# Patient Record
Sex: Female | Born: 1980
Health system: Southern US, Community
[De-identification: ages and names within clinical notes are randomized; demographics above are authoritative.]

## PROBLEM LIST (undated history)

## (undated) DIAGNOSIS — Z9889 Other specified postprocedural states: Secondary | ICD-10-CM

## (undated) DIAGNOSIS — G8921 Chronic pain due to trauma: Secondary | ICD-10-CM

## (undated) DIAGNOSIS — Z974 Presence of external hearing-aid: Secondary | ICD-10-CM

## (undated) DIAGNOSIS — K219 Gastro-esophageal reflux disease without esophagitis: Secondary | ICD-10-CM

## (undated) DIAGNOSIS — F419 Anxiety disorder, unspecified: Secondary | ICD-10-CM

## (undated) DIAGNOSIS — R112 Nausea with vomiting, unspecified: Secondary | ICD-10-CM

## (undated) DIAGNOSIS — N809 Endometriosis, unspecified: Secondary | ICD-10-CM

## (undated) DIAGNOSIS — Z8489 Family history of other specified conditions: Secondary | ICD-10-CM

## (undated) HISTORY — DX: Chronic pain due to trauma: G89.21

## (undated) HISTORY — PX: CHOLECYSTECTOMY: SHX55

## (undated) HISTORY — DX: Endometriosis, unspecified: N80.9

## (undated) HISTORY — DX: Anxiety disorder, unspecified: F41.9

## (undated) HISTORY — DX: Gastro-esophageal reflux disease without esophagitis: K21.9

## (undated) HISTORY — PX: OTHER SURGICAL HISTORY: SHX169

---

## 2005-05-14 ENCOUNTER — Ambulatory Visit: Payer: Self-pay | Admitting: General Surgery

## 2005-12-16 HISTORY — PX: DILATION AND CURETTAGE OF UTERUS: SHX78

## 2006-02-19 ENCOUNTER — Ambulatory Visit: Payer: Self-pay | Admitting: General Practice

## 2006-03-17 ENCOUNTER — Emergency Department: Payer: Self-pay | Admitting: Emergency Medicine

## 2006-03-25 ENCOUNTER — Ambulatory Visit: Payer: Self-pay | Admitting: Gastroenterology

## 2006-05-13 ENCOUNTER — Ambulatory Visit: Payer: Self-pay | Admitting: Unknown Physician Specialty

## 2006-12-16 HISTORY — PX: TONSILLECTOMY: SUR1361

## 2007-02-19 ENCOUNTER — Emergency Department: Payer: Self-pay | Admitting: General Practice

## 2007-02-24 ENCOUNTER — Ambulatory Visit: Payer: Self-pay | Admitting: General Practice

## 2007-11-27 ENCOUNTER — Ambulatory Visit: Payer: Self-pay | Admitting: General Practice

## 2007-12-22 ENCOUNTER — Ambulatory Visit: Payer: Self-pay | Admitting: General Practice

## 2008-03-05 ENCOUNTER — Emergency Department: Payer: Self-pay | Admitting: Emergency Medicine

## 2008-03-24 ENCOUNTER — Ambulatory Visit: Payer: Self-pay | Admitting: Obstetrics and Gynecology

## 2008-04-01 ENCOUNTER — Ambulatory Visit: Payer: Self-pay | Admitting: Obstetrics and Gynecology

## 2009-07-30 ENCOUNTER — Emergency Department: Payer: Self-pay | Admitting: Emergency Medicine

## 2009-11-29 ENCOUNTER — Ambulatory Visit: Payer: Self-pay | Admitting: General Practice

## 2009-12-16 DIAGNOSIS — D332 Benign neoplasm of brain, unspecified: Secondary | ICD-10-CM

## 2009-12-16 HISTORY — DX: Benign neoplasm of brain, unspecified: D33.2

## 2009-12-16 HISTORY — PX: SHOULDER SURGERY: SHX246

## 2009-12-16 HISTORY — PX: BRAIN SURGERY: SHX531

## 2010-01-27 ENCOUNTER — Ambulatory Visit: Payer: Self-pay | Admitting: General Practice

## 2010-01-31 ENCOUNTER — Ambulatory Visit: Payer: Self-pay | Admitting: General Practice

## 2010-02-13 ENCOUNTER — Inpatient Hospital Stay (HOSPITAL_COMMUNITY): Admission: RE | Admit: 2010-02-13 | Discharge: 2010-02-14 | Payer: Self-pay | Admitting: Neurosurgery

## 2010-02-13 ENCOUNTER — Encounter (INDEPENDENT_AMBULATORY_CARE_PROVIDER_SITE_OTHER): Payer: Self-pay | Admitting: Neurosurgery

## 2010-03-27 ENCOUNTER — Emergency Department: Payer: Self-pay | Admitting: Emergency Medicine

## 2010-04-07 ENCOUNTER — Ambulatory Visit: Payer: Self-pay | Admitting: Neurosurgery

## 2010-06-29 ENCOUNTER — Ambulatory Visit: Payer: Self-pay | Admitting: General Practice

## 2010-10-06 ENCOUNTER — Emergency Department: Payer: Self-pay | Admitting: Emergency Medicine

## 2011-01-10 ENCOUNTER — Other Ambulatory Visit: Payer: Self-pay | Admitting: Obstetrics and Gynecology

## 2011-03-08 LAB — DIFFERENTIAL
Basophils Absolute: 0 10*3/uL (ref 0.0–0.1)
Lymphocytes Relative: 34 % (ref 12–46)
Lymphs Abs: 2.2 10*3/uL (ref 0.7–4.0)
Monocytes Absolute: 0.4 10*3/uL (ref 0.1–1.0)
Monocytes Relative: 6 % (ref 3–12)
Neutro Abs: 3.7 10*3/uL (ref 1.7–7.7)

## 2011-03-08 LAB — CBC
HCT: 36.8 % (ref 36.0–46.0)
Hemoglobin: 12.8 g/dL (ref 12.0–15.0)
RBC: 4.16 MIL/uL (ref 3.87–5.11)
WBC: 6.4 10*3/uL (ref 4.0–10.5)

## 2011-03-08 LAB — TYPE AND SCREEN

## 2011-03-08 LAB — SURGICAL PCR SCREEN
MRSA, PCR: NEGATIVE
Staphylococcus aureus: POSITIVE — AB

## 2011-03-08 LAB — ABO/RH: ABO/RH(D): O POS

## 2011-03-23 ENCOUNTER — Emergency Department: Payer: Self-pay | Admitting: Emergency Medicine

## 2011-04-16 ENCOUNTER — Emergency Department: Payer: Self-pay | Admitting: Emergency Medicine

## 2011-06-18 ENCOUNTER — Ambulatory Visit: Payer: Self-pay | Admitting: General Practice

## 2011-07-13 IMAGING — CR DG CHEST 2V
1 series · 2 of 2 positions shown · non-contrast
Comparison: none

REASON FOR EXAM: pain after injury  -  ed waiting room
COMMENTS:   May transport without cardiac monitor

PROCEDURE:     DXR - DXR CHEST PA (OR AP) AND LATERAL  - April 16, 2011  [DATE]
RESULT:     The lung fields are clear. No pneumonia, pneumothorax or pleural
effusion is seen. The heart size is normal. The mediastinal and osseous
structures reveal no significant abnormalities.

[Series 1: view not recorded · 0.17mm/px · 2 of 2 slices shown]
[im 1/2]
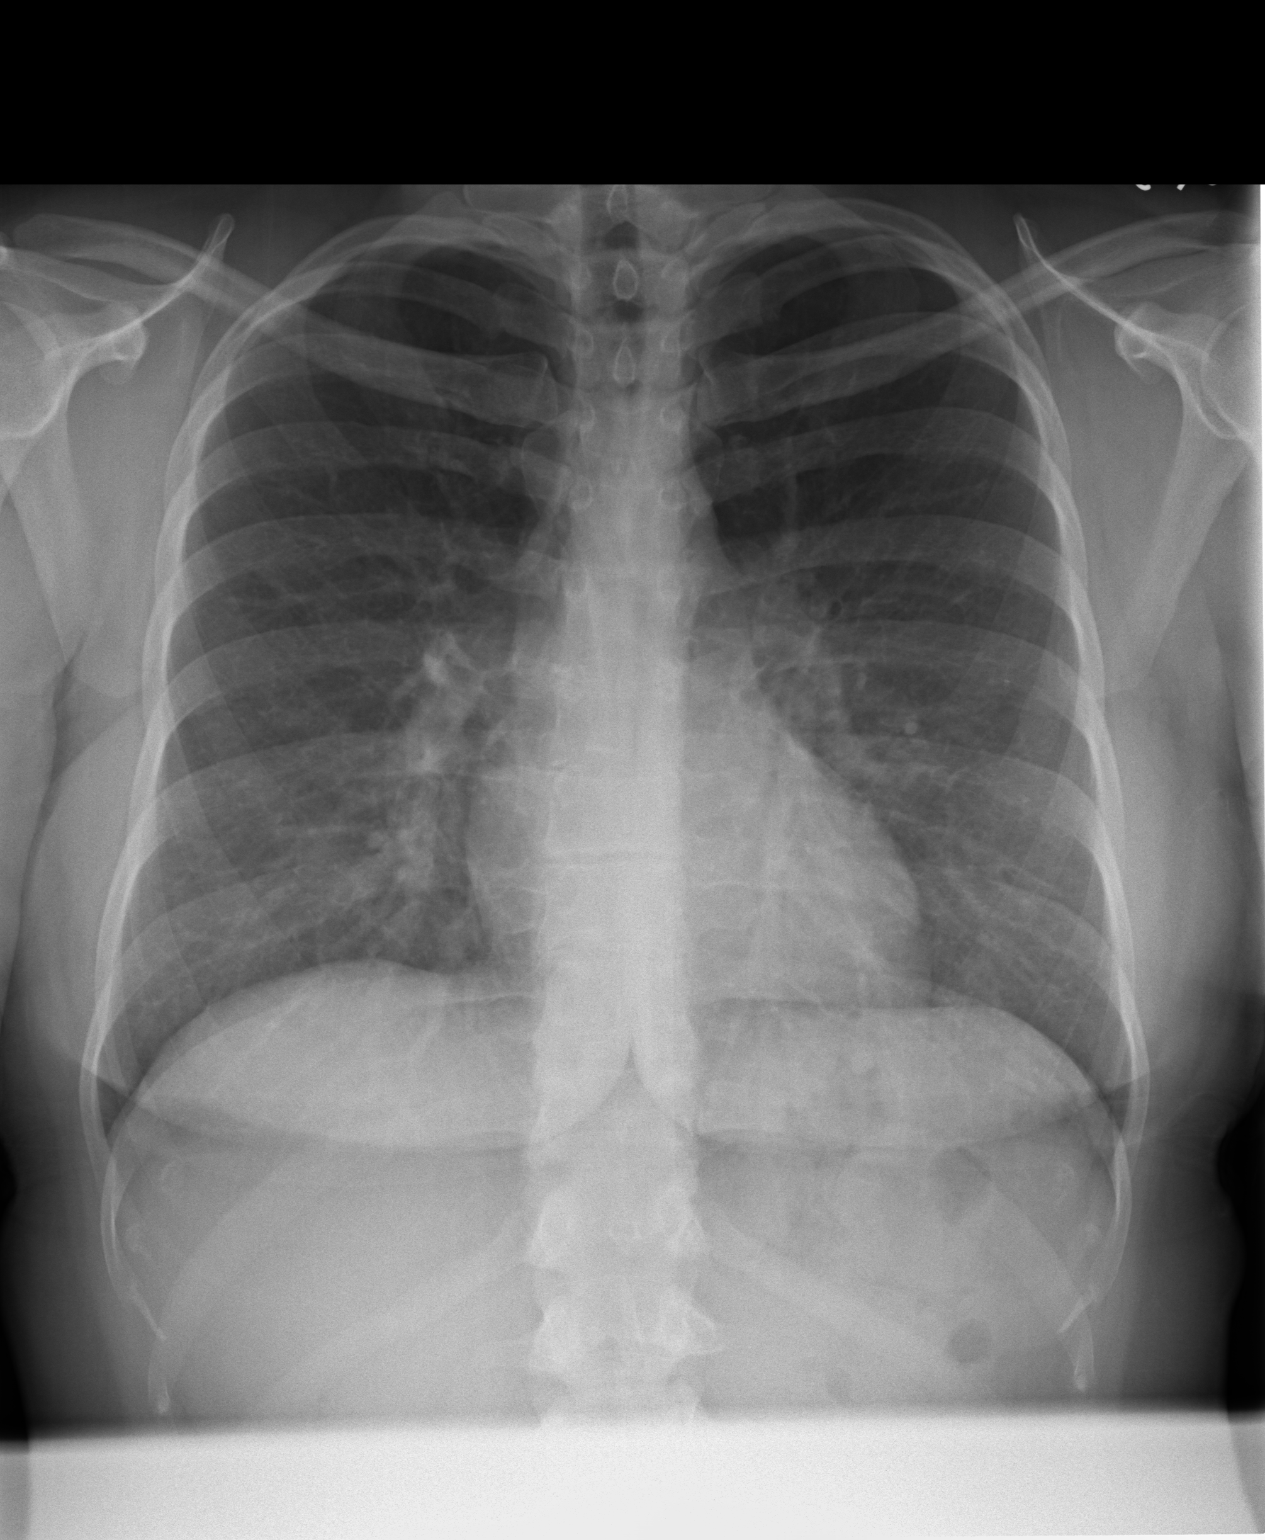
[im 2/2]
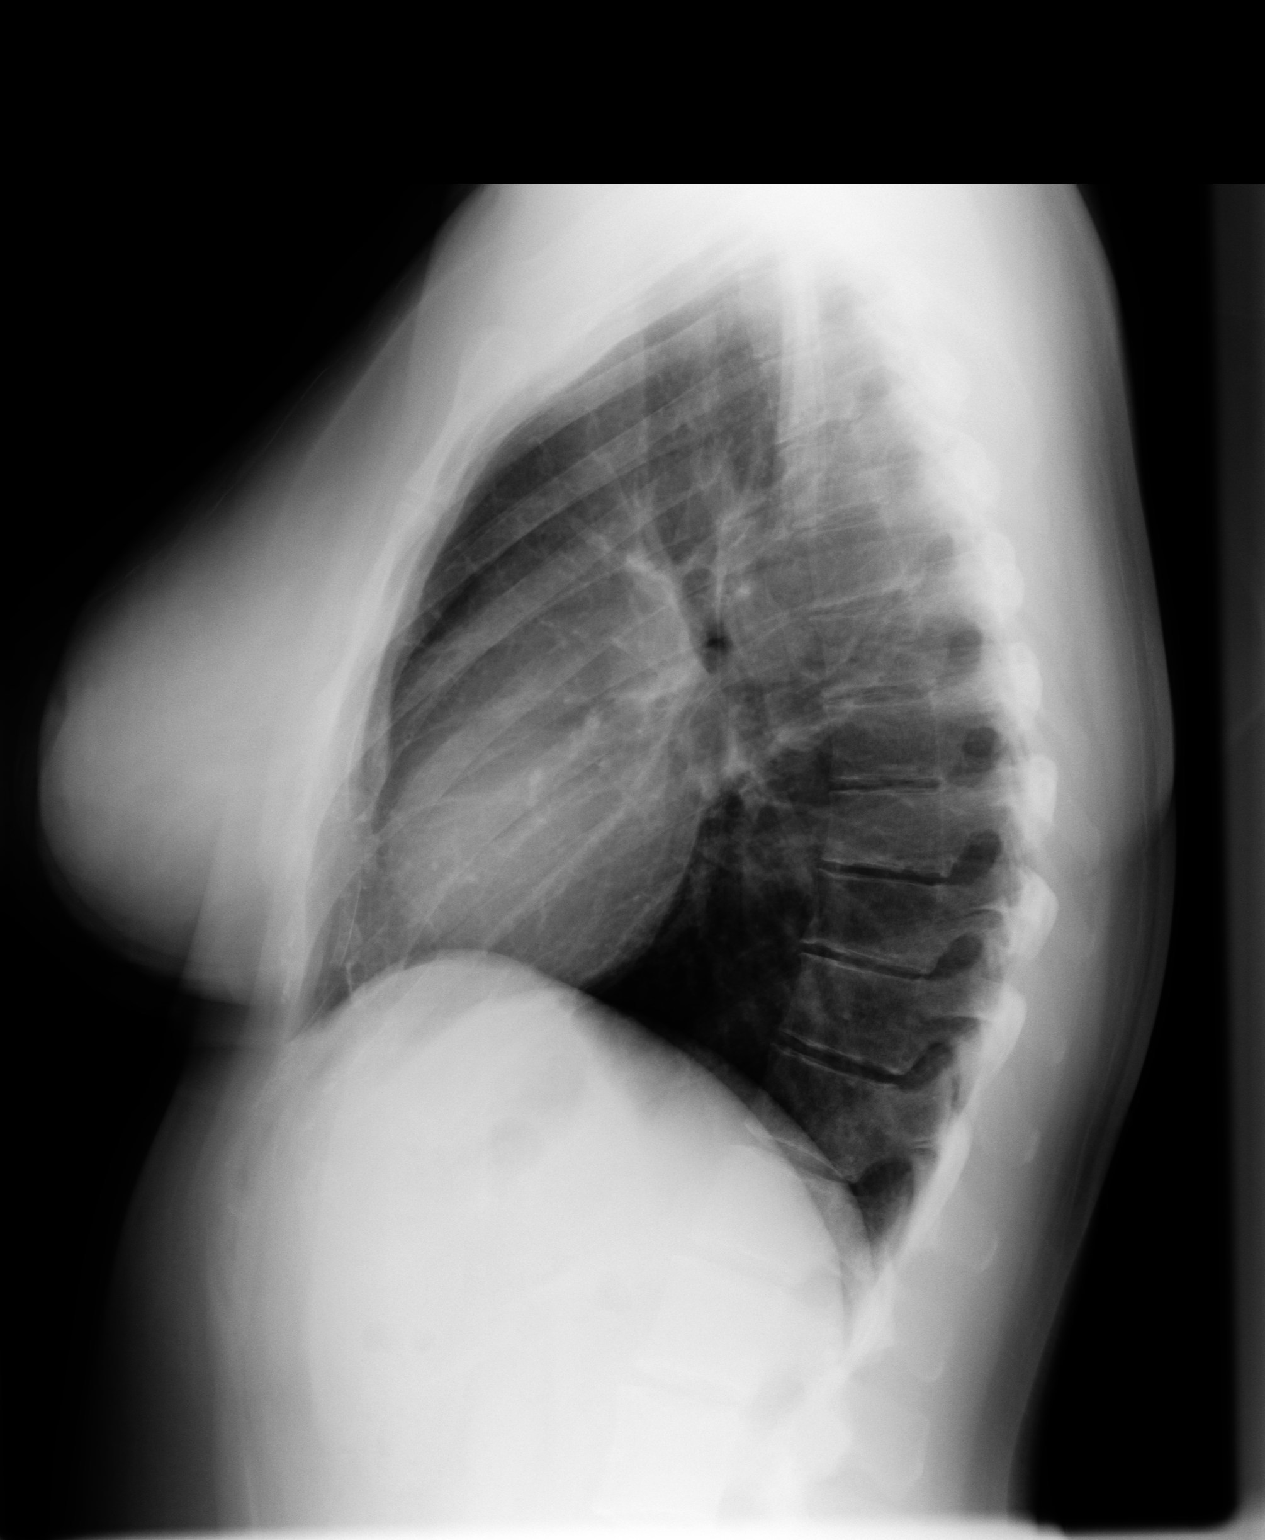

[2 of 2 positions shown; findings below may reference images not displayed]

IMPRESSION: No acute changes are identified.

## 2011-12-17 HISTORY — PX: SPINE SURGERY: SHX786

## 2012-01-10 ENCOUNTER — Encounter: Payer: Self-pay | Admitting: General Practice

## 2012-01-17 ENCOUNTER — Encounter: Payer: Self-pay | Admitting: General Practice

## 2012-02-14 ENCOUNTER — Encounter: Payer: Self-pay | Admitting: General Practice

## 2012-08-31 ENCOUNTER — Ambulatory Visit: Payer: Self-pay | Admitting: Unknown Physician Specialty

## 2013-02-05 ENCOUNTER — Emergency Department: Payer: Self-pay | Admitting: Unknown Physician Specialty

## 2013-02-05 LAB — CBC
MCHC: 33.1 g/dL (ref 32.0–36.0)
RDW: 13 % (ref 11.5–14.5)
WBC: 8.9 10*3/uL (ref 3.6–11.0)

## 2013-02-05 LAB — URINALYSIS, COMPLETE
Blood: NEGATIVE
Glucose,UR: NEGATIVE mg/dL (ref 0–75)
Hyaline Cast: 3
Specific Gravity: 1.008 (ref 1.003–1.030)
Squamous Epithelial: 6
WBC UR: 3 /HPF (ref 0–5)

## 2013-02-05 LAB — PREGNANCY, URINE: Pregnancy Test, Urine: NEGATIVE m[IU]/mL

## 2013-02-05 LAB — COMPREHENSIVE METABOLIC PANEL
Anion Gap: 4 — ABNORMAL LOW (ref 7–16)
Co2: 30 mmol/L (ref 21–32)
EGFR (African American): >60
EGFR (Non-African Amer.): >60
Potassium: 3.5 mmol/L (ref 3.5–5.1)
SGOT(AST): 29 U/L (ref 15–37)
Sodium: 140 mmol/L (ref 136–145)
Total Protein: 7.8 g/dL (ref 6.4–8.2)

## 2013-06-24 ENCOUNTER — Ambulatory Visit: Payer: Self-pay | Admitting: General Surgery

## 2013-07-29 ENCOUNTER — Encounter: Payer: Self-pay | Admitting: *Deleted

## 2015-05-29 ENCOUNTER — Encounter: Payer: Self-pay | Admitting: General Surgery

## 2015-05-29 ENCOUNTER — Other Ambulatory Visit: Payer: Self-pay | Admitting: Physician Assistant

## 2015-05-29 DIAGNOSIS — R928 Other abnormal and inconclusive findings on diagnostic imaging of breast: Secondary | ICD-10-CM

## 2015-05-29 DIAGNOSIS — R920 Mammographic microcalcification found on diagnostic imaging of breast: Secondary | ICD-10-CM

## 2015-06-02 ENCOUNTER — Ambulatory Visit
Admission: RE | Admit: 2015-06-02 | Discharge: 2015-06-02 | Disposition: A | Discharge status: Home / Self Care | Payer: BLUE CROSS/BLUE SHIELD | Source: Ambulatory Visit | Attending: Physician Assistant | Admitting: Physician Assistant

## 2015-06-02 DIAGNOSIS — R920 Mammographic microcalcification found on diagnostic imaging of breast: Secondary | ICD-10-CM

## 2015-06-02 DIAGNOSIS — R928 Other abnormal and inconclusive findings on diagnostic imaging of breast: Secondary | ICD-10-CM | POA: Insufficient documentation

## 2015-06-06 ENCOUNTER — Encounter: Payer: Self-pay | Admitting: General Surgery

## 2015-06-07 ENCOUNTER — Other Ambulatory Visit: Payer: BLUE CROSS/BLUE SHIELD

## 2015-06-07 ENCOUNTER — Encounter: Payer: Self-pay | Admitting: General Surgery

## 2015-06-07 ENCOUNTER — Ambulatory Visit (INDEPENDENT_AMBULATORY_CARE_PROVIDER_SITE_OTHER): Payer: BLUE CROSS/BLUE SHIELD | Admitting: General Surgery

## 2015-06-07 VITALS — BP 110/68 | HR 71 | Resp 13 | Ht 63.0 in | Wt 157.0 lb

## 2015-06-07 DIAGNOSIS — N644 Mastodynia: Secondary | ICD-10-CM | POA: Diagnosis not present

## 2015-06-07 DIAGNOSIS — M79622 Pain in left upper arm: Secondary | ICD-10-CM

## 2015-06-07 DIAGNOSIS — N6452 Nipple discharge: Secondary | ICD-10-CM

## 2015-06-07 DIAGNOSIS — M25512 Pain in left shoulder: Secondary | ICD-10-CM | POA: Diagnosis not present

## 2015-06-07 NOTE — Progress Notes (Signed)
Patient ID: Elizabeth Peters, female   DOB: 27-Nov-1981, 34 y.o.   MRN: [id number]  Chief Complaint  Patient presents with  . Other    right breast cyst, left axillary nodule    HPI Elizabeth Peters is a 34 y.o. female here for assessment of a right breast cyst and left axillary nodule and pain. She reports this started with sharp pains in left nipple and pain under arm about 3 weeks ago. She reports the pain as constant. She also reports nipple discharge that is clear and spontaneously occurs for about a week at a time, once a month. This discharge is nonbloody. This has been occuring for about a year but is not related to her cycle.  She has a right breast mass that was found on her exam. She reports that area as tender when palpated. She reports that she no longer has sensation in her left nipple since the onset of the left lateral breast pain 3 weeks ago . Her last mammogram and ultrasound was on 06/02/15 [facility]. She does not do self breast exams.  HPI  Past Medical History  Diagnosis Date  . Chronic pain due to injury   . Endometriosis     Past Surgical History  Procedure Laterality Date  . Dilation and curettage of uterus  2007  . Tonsillectomy  2008  . Brain surgery  2011    tumor  . Shoulder surgery Left 2011  . Spine surgery  2013    neck fulsion    Family History  Problem Relation Age of Onset  . Breast cancer Mother 66  . Skin cancer Father   . Heart attack Father     Social History History  Substance Use Topics  . Smoking status: Former Smoker -- 4 years    Quit date: 12/16/2012  . Smokeless tobacco: Never Used  . Alcohol Use: No    Allergies  Allergen Reactions  . Aspirin Anaphylaxis  . Penicillins Anaphylaxis    Current Outpatient Prescriptions  Medication Sig Dispense Refill  . gabapentin (NEURONTIN) 300 MG capsule Take 300 mg by mouth 2 (two) times daily.  2  . NUCYNTA ER 50 MG TB12 Take 1 tablet by mouth 2 (two) times daily.  0  . oxymorphone (OPANA)  10 MG tablet Take 10 mg by mouth 2 (two) times daily.  0   No current facility-administered medications for this visit.    Review of Systems Review of Systems  Constitutional: Negative.   Respiratory: Negative.   Cardiovascular: Negative.     Blood pressure 110/68, pulse 71, resp. rate 13, height 5\' 3"  (1.6 m), weight 157 lb (71.215 kg), last menstrual period 06/01/2015.  Physical Exam Physical Exam  Constitutional: She is oriented to person, place, and time. She appears well-developed and well-nourished.  Eyes: Conjunctivae are normal. No scleral icterus.  Neck: Neck supple.  Cardiovascular: Normal rate, regular rhythm and normal heart sounds.   Pulmonary/Chest: Effort normal and breath sounds normal. Right breast exhibits no inverted nipple, no mass, no nipple discharge, no skin change and no tenderness. Left breast exhibits nipple discharge and tenderness. Left breast exhibits no inverted nipple, no mass and no skin change.  Lymphadenopathy:    She has no cervical adenopathy.    She has no axillary adenopathy.  Neurological: She is alert and oriented to person, place, and time.  Skin: Skin is warm and dry.    Data Reviewed 05/29/2015 city of [place] notes reviewed.  06/02/2015 bilateral mammograms  were reviewed and are unremarkable.  Ultrasound examination was completed of both the left axilla and the right breast in the area of questionable palpable abnormality without any radiologic abnormality noted.  Focused ultrasound examination of the left retroareolar area was completed due to the report of nipple drainage for the past year. Scanning throughout the area showed no discernible ductal dilatation, cystic or solid lesions or architectural distortion. BI-RADS-1. (No images).  Assessment    Benign breast exam. Left breast nipple drainage.    Plan    Nipple cytology was completed due to the long history reported by the patient and her family history of breast cancer  in her mother at age 35. This is likely to be normal.  There is no significant change in her diet, caffeine consumption or medications to account for the nipple drainage or breast tenderness.  Symptomatic measures for relief of her breast discomfort: Proper fitting underwear, local heat/ice and anti-inflammatory agents were recommended. Aleve, 2 tablets twice a day for a ten-day course has been encouraged, assuming no GI side effects, with a phone report in 2 weeks of her progress.    PCP: Kandace Parkins 06/08/2015, 6:27 PM

## 2015-06-07 NOTE — Patient Instructions (Signed)
May use aleve for pain twice daily for 10 days. Wear well fitting bra. May use heat or ice for comfort. Call with progress in 2 weeks.

## 2015-06-08 DIAGNOSIS — N644 Mastodynia: Secondary | ICD-10-CM | POA: Insufficient documentation

## 2015-06-08 DIAGNOSIS — M79629 Pain in unspecified upper arm: Secondary | ICD-10-CM | POA: Insufficient documentation

## 2015-06-08 DIAGNOSIS — N6452 Nipple discharge: Secondary | ICD-10-CM | POA: Insufficient documentation

## 2015-06-09 LAB — BREAST DISCHARGE CYTOLOGY

## 2015-06-15 ENCOUNTER — Telehealth: Payer: Self-pay | Admitting: *Deleted

## 2015-06-15 NOTE — Telephone Encounter (Signed)
Notified patient as instructed, patient pleased. Discussed follow-up appointments, patient agrees  

## 2015-06-15 NOTE — Telephone Encounter (Signed)
-----   Message from Robert Bellow, MD sent at 06/15/2015  8:08 AM EDT ----- Please notify the patient the cytology exam was normal. Follow-up by phone with progress regarding her discomfort as previously discussed. ----- Message -----    From: [facility] Lab Results In Interface    Sent: 06/09/2015   4:38 PM      To: Robert Bellow, MD

## 2016-07-09 ENCOUNTER — Encounter: Payer: Self-pay | Admitting: Obstetrics and Gynecology

## 2016-08-14 ENCOUNTER — Encounter: Payer: Self-pay | Admitting: Obstetrics and Gynecology

## 2016-09-30 ENCOUNTER — Ambulatory Visit
Admission: RE | Admit: 2016-09-30 | Discharge: 2016-09-30 | Disposition: A | Discharge status: Home / Self Care | Payer: BLUE CROSS/BLUE SHIELD | Source: Ambulatory Visit | Attending: Family Medicine | Admitting: Family Medicine

## 2016-09-30 ENCOUNTER — Other Ambulatory Visit: Payer: Self-pay | Admitting: Family Medicine

## 2016-09-30 DIAGNOSIS — N839 Noninflammatory disorder of ovary, fallopian tube and broad ligament, unspecified: Secondary | ICD-10-CM | POA: Diagnosis present

## 2016-09-30 DIAGNOSIS — N838 Other noninflammatory disorders of ovary, fallopian tube and broad ligament: Secondary | ICD-10-CM

## 2017-02-10 ENCOUNTER — Encounter: Payer: Self-pay | Admitting: *Deleted

## 2017-02-10 ENCOUNTER — Ambulatory Visit
Admission: RE | Admit: 2017-02-10 | Discharge: 2017-02-10 | Disposition: A | Discharge status: Home / Self Care | Payer: Self-pay | Source: Ambulatory Visit | Attending: Oncology | Admitting: Oncology

## 2017-02-10 ENCOUNTER — Ambulatory Visit: Payer: Self-pay | Attending: Oncology | Admitting: *Deleted

## 2017-02-10 ENCOUNTER — Encounter (INDEPENDENT_AMBULATORY_CARE_PROVIDER_SITE_OTHER): Payer: Self-pay

## 2017-02-10 VITALS — BP 119/79 | HR 65 | Temp 98.6°F | Ht 64.96 in | Wt 173.3 lb

## 2017-02-10 DIAGNOSIS — N63 Unspecified lump in unspecified breast: Secondary | ICD-10-CM

## 2017-02-10 NOTE — Progress Notes (Signed)
Subjective:     Patient ID: Elizabeth Peters, female   DOB: 1981/10/25, 36 y.o.   MRN: [id number]  HPI   Review of Systems     Objective:   Physical Exam  Pulmonary/Chest: Right breast exhibits tenderness. Right breast exhibits no inverted nipple, no mass, no nipple discharge and no skin change. Left breast exhibits mass, nipple discharge and tenderness. Left breast exhibits no inverted nipple and no skin change. Breasts are symmetrical.         Assessment:     36 year old White female referred to [place] by Dr. Corinda Gubler at the [facility] for bilateral breast masses and left bloody nipple discharge.  Patient states her nipple turned "purple" about 3 weeks ago and she started having bloody nipple discharge from the left breast.  States she is having pain at the 6:00 area radiating to the sternum.  States "I almost thought I was having a heart attack"  The pain does not correlate with her menstrual cycle since she is on her cycle today.  States the bloody nipple discharge is spontaneous, and just appears as a bright red blood in her bra.  On clinical breast exam I can palpate tender fibroglandular like nodular soft tissue at 6:00 right breast, and a tender approximate 4 X1 cm thickening at 6:00 left breast.  There is no nipple discharge on exam today.  Patient was seen in June 2016 for left breast nipple discharge and mass with benign findings.  Patient has been screened for eligibility.  She does not have any insurance, Medicare or Medicaid.  She also meets financial eligibility.  Hand-out given on the Affordable Care Act.    Plan:     Bilateral diagnostic mammogram and ultrasound ordered.  The patient has also been scheduled to see Dr. Bary Castilla on 02/20/17 @ 4:00 for further evaluation of the masses and bloody nipple discharge.  Will Follow - up per [facility] protocol.

## 2017-02-10 NOTE — Patient Instructions (Signed)
Gave patient hand-out, Women Staying Healthy, Active and Well from [facility], with education on breast health, pap smears, heart and colon health. 

## 2017-02-17 ENCOUNTER — Encounter: Payer: Self-pay | Admitting: *Deleted

## 2017-02-20 ENCOUNTER — Ambulatory Visit: Payer: BLUE CROSS/BLUE SHIELD | Admitting: General Surgery

## 2017-03-05 ENCOUNTER — Telehealth: Payer: Self-pay | Admitting: *Deleted

## 2017-03-05 NOTE — Telephone Encounter (Signed)
Called patient today to discuss follow up with a surgeon.  States she cancelled her appointment after the radiologist told her everything was fine.  I did discuss the need for a second opinion with a surgeon since she did have bloody nipple discharge and a thickening noted at 6:00 in the same breast.  States she will reschedule her appointment.

## 2017-03-24 ENCOUNTER — Encounter: Payer: Self-pay | Admitting: General Surgery

## 2017-03-24 ENCOUNTER — Ambulatory Visit (INDEPENDENT_AMBULATORY_CARE_PROVIDER_SITE_OTHER): Payer: PRIVATE HEALTH INSURANCE | Admitting: General Surgery

## 2017-03-24 ENCOUNTER — Inpatient Hospital Stay: Payer: Self-pay

## 2017-03-24 VITALS — BP 118/74 | HR 80 | Resp 12 | Ht 65.0 in | Wt 174.0 lb

## 2017-03-24 DIAGNOSIS — N644 Mastodynia: Secondary | ICD-10-CM

## 2017-03-24 DIAGNOSIS — N6452 Nipple discharge: Secondary | ICD-10-CM

## 2017-03-24 NOTE — Progress Notes (Addendum)
Patient ID: Elizabeth Peters, female   DOB: 1981-04-22, 36 y.o.   MRN: [id number]  Chief Complaint  Patient presents with  . Other    HPI Elizabeth Peters is a 36 y.o. female who presents for a breast evaluation. The most recent mammogram and ultrasounds was done on 02/10/2017 .  Patient does not perform regular self breast checks and gets regular mammograms done.  Patient states she has been having left nipple discharge and this has been going on since January 2018.She states the discharge is clean with a tint of blood. She states the left nipple is very ichthy and painful to touch.The chest wall is sore also.  Family history of breast cancer.  HPI  Past Medical History:  Diagnosis Date  . Chronic pain due to injury   . Endometriosis     Past Surgical History:  Procedure Laterality Date  . BRAIN SURGERY  2011   tumor  . DILATION AND CURETTAGE OF UTERUS  2007  . SHOULDER SURGERY Left 2011  . SPINE SURGERY  2013   neck fulsion  . TONSILLECTOMY  2008    Family History  Problem Relation Age of Onset  . Breast cancer Mother 62  . Skin cancer Father   . Heart attack Father   . Colon cancer Paternal Grandmother   . Lung cancer Paternal Grandfather   . Breast cancer Cousin     maternal side early 32's    Social History Social History  Substance Use Topics  . Smoking status: Former Smoker    Years: 4.00    Quit date: 12/16/2012  . Smokeless tobacco: Never Used  . Alcohol use No    Allergies  Allergen Reactions  . Aspirin Anaphylaxis  . Penicillins Anaphylaxis    Current Outpatient Prescriptions  Medication Sig Dispense Refill  . cloNIDine (CATAPRES) 0.1 MG tablet Take 0.1 mg by mouth 2 (two) times daily.    . cyclobenzaprine (FLEXERIL) 10 MG tablet Take 10 mg by mouth 3 (three) times daily as needed for muscle spasms.    Gean Birchwood ER 50 MG TB12 Take 1 tablet by mouth 2 (two) times daily.  0   No current facility-administered medications for this visit.     Review of  Systems Review of Systems  Constitutional: Negative.   Respiratory: Negative.   Cardiovascular: Negative.     Blood pressure 118/74, pulse 80, resp. rate 12, height 5\' 5"  (1.651 m), weight 174 lb (78.9 kg), last menstrual period 03/12/2017.  Physical Exam Physical Exam  Constitutional: She is oriented to person, place, and time. She appears well-developed and well-nourished.  Eyes: Conjunctivae are normal. No scleral icterus.  Neck: Neck supple.  Cardiovascular: Normal rate, regular rhythm and normal heart sounds.   Pulmonary/Chest: Effort normal and breath sounds normal. Right breast exhibits no inverted nipple, no mass, no nipple discharge, no skin change and no tenderness. Left breast exhibits tenderness ( left breast tenderness at 6o;clock and laterally.  ). Left breast exhibits no inverted nipple, no mass and no skin change.    Lymphadenopathy:    She has no cervical adenopathy.    She has no axillary adenopathy.  Neurological: She is alert and oriented to person, place, and time.  Skin: Skin is warm and dry.  Vitals reviewed.   Data: February 10, 2017 bilateral diagnostic mammogram and left breast ultrasound reviewed. Mammogram portion benign. Radiologist recommendation regarding future mammograms and potential for MRI exam were reviewed and discussed with the patient. BI-RADS-2. Physician  describes clear nipple drainage from 2 ducts.  Ultrasound of the retroareolar area was negative.  Ultrasound examination of the upper-outer quadrant of the left breast was completed due to a focal thickening noted on clinical exam. In the axillary tail there is a area encompassing what appeared be multiple small cyst 1.28 x 1.54 x 2.24 cm in diameter. This area approaches the overlying dermis to within 0.8 cm. Sick ounce remote from the prominence.  An area of soft fullness at the 2:00 position, 5 cm from the nipple is isodense with the adjacent subcutaneous fat and is suggested be of  adipose deposit measuring 0.9 x 1.3 x 1.8 cm. Focal acoustic enhancement is noted throughout the area and no vascular flow.  There is no evidence of ductal dilatation in the retroareolar area and it was not possible to reproduce any nipple drainage today. BI-RADS-2.  Assessment    Diffuse left breast tenderness, focal thickening related to microcyst.    Plan    Trial of anti-inflammatories is recommended.   Take two Advil  two times daily for two weeks. Use ocuvite, Call in two weeks to report.    HPI, Physical Exam, Assessment and Plan have been scribed under the direction and in the presence of Hervey Ard, MD.  Gaspar Cola, CMA  I have completed the exam and reviewed the above documentation for accuracy and completeness.  I agree with the above.  Haematologist has been used and any errors in dictation or transcription are unintentional.  Hervey Ard, M.D., F.A.C.S.  Robert Bellow 03/25/2017, 8:18 PM

## 2017-03-24 NOTE — Patient Instructions (Addendum)
Take two Advil  two times daily for two weeks. Use ocuvite, Call in two weeks to report.

## 2017-03-27 ENCOUNTER — Encounter: Payer: Self-pay | Admitting: *Deleted

## 2017-03-27 NOTE — Progress Notes (Signed)
Patient saw Dr. Bary Castilla for surgical consult.  She is to follow-up with him for symptom management.  Screening mammogram at age 36.  HSIS to [place].

## 2017-04-09 ENCOUNTER — Encounter: Payer: Self-pay | Admitting: *Deleted

## 2017-04-09 ENCOUNTER — Ambulatory Visit
Admission: EM | Admit: 2017-04-09 | Discharge: 2017-04-09 | Disposition: A | Discharge status: Home / Self Care | Payer: Self-pay | Attending: Family Medicine | Admitting: Family Medicine

## 2017-04-09 DIAGNOSIS — G43001 Migraine without aura, not intractable, with status migrainosus: Secondary | ICD-10-CM

## 2017-04-09 DIAGNOSIS — R110 Nausea: Secondary | ICD-10-CM

## 2017-04-09 MED ORDER — ONDANSETRON 8 MG PO TBDP
8.0000 mg | ORAL_TABLET | Freq: Once | ORAL | Status: AC
  Administered 2017-04-09: 8 mg via ORAL

## 2017-04-09 MED ORDER — KETOROLAC TROMETHAMINE 60 MG/2ML IM SOLN
60.0000 mg | Freq: Once | INTRAMUSCULAR | Status: AC
  Administered 2017-04-09: 60 mg via INTRAMUSCULAR

## 2017-04-09 NOTE — ED Provider Notes (Signed)
[facility]    CSN: [id number] Arrival date & time: 04/09/17  1511     History   Chief Complaint Chief Complaint  Patient presents with  . Migraine    HPI Elizabeth Peters is a 36 y.o. female.   Patient is a 36 year old white female whose on disability for the [employer] was here because of migraine headache. She reports having a migraine for about 3 days. This is a more extensive migraine that she normally had. Affect is probably the worst migraine that she's had however patient has had headaches before and has had headaches worsen this because she has had a brain tumor and attempted craniotomy to have a brain tumor removed. She states that was benign tumor she just had a CT scan a few months ago and everything was fine. She states that she is on disability now because after having the craniotomy for the brain tumor she went back to work and got flipped by her dog landing on her head and neck resulting in a fusion of her neck and injury to her left shoulder. She's doesn't have any drug allergies. She does state that Imitrex and Topamax did not work for her. She has some nausea now and has she puts it should like to get a shot in her buttocks to stop pain. She is willing to take some Zofran as well. She does have an allergy to aspirin but she can take ibuprofen so assuming that she could take Toradol as well. She does not smoke. No chronic medical problems otherwise. She's had a D&C for endometriosis, shoulder surgery craniotomy and neck fusion and a tonsillectomy. There is a history of migraines in the family but otherwise no pertinent family medical history relevant for today's visit. Should be noted that this headache has gone on for at least 3 days now and is not responding to usual home remedies.   The history is provided by the patient. No language interpreter was used.  Migraine  This is a new problem. The current episode started 2 days ago. Associated symptoms include  headaches. Nothing aggravates the symptoms. Nothing relieves the symptoms. She has tried acetaminophen, a cold compress, rest, food, water and a warm compress (Massage and trigger point treatments) for the symptoms.    Past Medical History:  Diagnosis Date  . Chronic pain due to injury   . Endometriosis     Patient Active Problem List   Diagnosis Date Noted  . Axillary pain 06/08/2015  . Discharge from left nipple 06/08/2015  . Mastalgia 06/08/2015    Past Surgical History:  Procedure Laterality Date  . BRAIN SURGERY  2011   tumor  . DILATION AND CURETTAGE OF UTERUS  2007  . SHOULDER SURGERY Left 2011  . SPINE SURGERY  2013   neck fulsion  . TONSILLECTOMY  2008    OB History    No data available       Home Medications    Prior to Admission medications   Medication Sig Start Date End Date Taking? Authorizing Provider  clonazePAM (KLONOPIN) 1 MG tablet Take 1 mg by mouth 2 (two) times daily as needed for anxiety.   Yes Historical Provider, MD  cloNIDine (CATAPRES) 0.1 MG tablet Take 0.1 mg by mouth 2 (two) times daily.   Yes Historical Provider, MD  cyclobenzaprine (FLEXERIL) 10 MG tablet Take 10 mg by mouth 3 (three) times daily as needed for muscle spasms.   Yes Historical Provider, MD  NUCYNTA ER  50 MG TB12 Take 1 tablet by mouth 2 (two) times daily. 05/04/15   Historical Provider, MD    Family History Family History  Problem Relation Age of Onset  . Breast cancer Mother 46  . Skin cancer Father   . Heart attack Father   . Colon cancer Paternal Grandmother   . Lung cancer Paternal Grandfather   . Breast cancer Cousin     maternal side early 96's    Social History Social History  Substance Use Topics  . Smoking status: Former Smoker    Years: 4.00    Quit date: 12/16/2012  . Smokeless tobacco: Never Used  . Alcohol use No     Allergies   Aspirin and Penicillins   Review of Systems Review of Systems  Gastrointestinal: Positive for nausea.    Neurological: Positive for headaches.  All other systems reviewed and are negative.    Physical Exam Triage Vital Signs ED Triage Vitals  Enc Vitals Group     BP 04/09/17 1519 127/78     Pulse Rate 04/09/17 1519 72     Resp 04/09/17 1519 16     Temp 04/09/17 1519 98.7 F (37.1 C)     Temp Source 04/09/17 1519 Oral     SpO2 04/09/17 1519 100 %     Weight --      Height --      Head Circumference --      Peak Flow --      Pain Score 04/09/17 1521 8     Pain Loc --      Pain Edu? --      Excl. in [place]? --    No data found.   Updated Vital Signs BP 127/78 (BP Location: Left Arm)   Pulse 72   Temp 98.7 F (37.1 C) (Oral)   Resp 16   LMP 03/12/2017   SpO2 100%   Visual Acuity Right Eye Distance:   Left Eye Distance:   Bilateral Distance:    Right Eye Near:   Left Eye Near:    Bilateral Near:     Physical Exam  Constitutional: She is oriented to person, place, and time. She appears well-developed and well-nourished. No distress.  HENT:  Head: Normocephalic and atraumatic.  Right Ear: External ear normal.  Eyes: Conjunctivae and EOM are normal. Pupils are equal, round, and reactive to light.  Neck: Normal range of motion. Neck supple.  Pulmonary/Chest: Effort normal.  Musculoskeletal: Normal range of motion. She exhibits no edema.  Neurological: She is alert and oriented to person, place, and time.  Skin: Skin is warm. She is not diaphoretic.  Psychiatric: She has a normal mood and affect.  Vitals reviewed.    UC Treatments / Results  Labs (all labs ordered are listed, but only abnormal results are displayed) Labs Reviewed - No data to display  EKG  EKG Interpretation None       Radiology No results found.  Procedures Procedures (including critical care time)  Medications Ordered in UC Medications  ketorolac (TORADOL) injection 60 mg (60 mg Intramuscular Given 04/09/17 1549)  ondansetron (ZOFRAN-ODT) disintegrating tablet 8 mg (8 mg Oral Given  04/09/17 1548)     Initial Impression / Assessment and Plan / UC Course  I have reviewed the triage vital signs and the nursing notes.  Pertinent labs & imaging results that were available during my care of the patient were reviewed by me and considered in my medical decision making (see chart for  details).   patient be given 60 Toradol IM 8 mg of ODT Zofran follow-up with PCP or city physician tomorrow if needed. Patient declined Imitrex injection    Final Clinical Impressions(s) / UC Diagnoses   Final diagnoses:  Migraine without aura and with status migrainosus, not intractable    New Prescriptions Discharge Medication List as of 04/09/2017  3:49 PM      Note: This dictation was prepared with Dragon dictation along with smaller phrase technology. Any transcriptional errors that result from this process are unintentional.   Frederich Cha, MD 04/09/17 [phone]

## 2017-04-09 NOTE — ED Triage Notes (Signed)
Patient started having symptom of migraine 3 days ago. Patient does have a history of head surgery.

## 2018-04-08 ENCOUNTER — Emergency Department: Payer: Self-pay

## 2018-04-08 ENCOUNTER — Other Ambulatory Visit: Payer: Self-pay

## 2018-04-08 ENCOUNTER — Encounter: Payer: Self-pay | Admitting: Emergency Medicine

## 2018-04-08 DIAGNOSIS — S60221A Contusion of right hand, initial encounter: Secondary | ICD-10-CM | POA: Insufficient documentation

## 2018-04-08 DIAGNOSIS — S5011XA Contusion of right forearm, initial encounter: Secondary | ICD-10-CM | POA: Insufficient documentation

## 2018-04-08 DIAGNOSIS — Y939 Activity, unspecified: Secondary | ICD-10-CM | POA: Insufficient documentation

## 2018-04-08 DIAGNOSIS — Y999 Unspecified external cause status: Secondary | ICD-10-CM | POA: Insufficient documentation

## 2018-04-08 DIAGNOSIS — S62114A Nondisplaced fracture of triquetrum [cuneiform] bone, right wrist, initial encounter for closed fracture: Secondary | ICD-10-CM | POA: Insufficient documentation

## 2018-04-08 DIAGNOSIS — Y929 Unspecified place or not applicable: Secondary | ICD-10-CM | POA: Insufficient documentation

## 2018-04-08 DIAGNOSIS — Z79899 Other long term (current) drug therapy: Secondary | ICD-10-CM | POA: Insufficient documentation

## 2018-04-08 DIAGNOSIS — W2209XA Striking against other stationary object, initial encounter: Secondary | ICD-10-CM | POA: Insufficient documentation

## 2018-04-08 DIAGNOSIS — Z87891 Personal history of nicotine dependence: Secondary | ICD-10-CM | POA: Insufficient documentation

## 2018-04-08 NOTE — ED Triage Notes (Signed)
Patient ambulatory to triage with steady gait, without difficulty or distress noted; pt reports right FA/wrist/hand pain after punching wall; seen by PCP but no xray performed

## 2018-04-09 ENCOUNTER — Emergency Department
Admission: EM | Admit: 2018-04-09 | Discharge: 2018-04-09 | Disposition: A | Discharge status: Home / Self Care | Payer: Self-pay | Attending: Emergency Medicine | Admitting: Emergency Medicine

## 2018-04-09 DIAGNOSIS — S62114A Nondisplaced fracture of triquetrum [cuneiform] bone, right wrist, initial encounter for closed fracture: Secondary | ICD-10-CM

## 2018-04-09 DIAGNOSIS — S60221A Contusion of right hand, initial encounter: Secondary | ICD-10-CM

## 2018-04-09 DIAGNOSIS — S5011XA Contusion of right forearm, initial encounter: Secondary | ICD-10-CM

## 2018-04-09 MED ORDER — ONDANSETRON 4 MG PO TBDP
4.0000 mg | ORAL_TABLET | Freq: Once | ORAL | Status: AC
  Administered 2018-04-09: 4 mg via ORAL

## 2018-04-09 MED ORDER — ONDANSETRON 4 MG PO TBDP
ORAL_TABLET | ORAL | Status: AC
  Filled 2018-04-09: qty 1

## 2018-04-09 MED ORDER — ONDANSETRON 4 MG PO TBDP: ORAL_TABLET | ORAL | 0 refills | Status: DC

## 2018-04-09 NOTE — ED Notes (Signed)
Pt states most pain in forearm, hand minorly sore. Full sensation and ROM intact. Strong grip.

## 2018-04-09 NOTE — ED Notes (Signed)
ED Provider at bedside. 

## 2018-04-09 NOTE — ED Notes (Signed)

## 2018-04-09 NOTE — ED Notes (Signed)
Pt requested zofran. VORB by Karma Greaser

## 2018-04-09 NOTE — ED Provider Notes (Signed)
[facility] Emergency Department Provider Note  ____________________________________________   First MD Initiated Contact with Patient 04/09/18 0119     (approximate)  I have reviewed the triage vital signs and the nursing notes.   HISTORY  Chief Complaint Arm Injury    HPI Elizabeth Peters is a 37 y.o. female who presents for evaluation of her right wrist and forearm after punching a wall.  She has a bruise and a "goose egg" on the middle part of her right forearm, some swelling to her index finger and also some pain in her wrist.  She is able to flex and extend her wrist without too much difficulty and with only mild pain.  She is having some nausea but no vomiting.  She did not sustain any other injuries.  She is neurovascularly intact with no numbness nor tingling.  Minimal swelling, no bruising except for the contusion to the right forearm.  Past Medical History:  Diagnosis Date  . Chronic pain due to injury   . Endometriosis     Patient Active Problem List   Diagnosis Date Noted  . Axillary pain 06/08/2015  . Discharge from left nipple 06/08/2015  . Mastalgia 06/08/2015    Past Surgical History:  Procedure Laterality Date  . BRAIN SURGERY  2011   tumor  . DILATION AND CURETTAGE OF UTERUS  2007  . SHOULDER SURGERY Left 2011  . SPINE SURGERY  2013   neck fulsion  . TONSILLECTOMY  2008    Prior to Admission medications   Medication Sig Start Date End Date Taking? Authorizing Provider  clonazePAM (KLONOPIN) 1 MG tablet Take 1 mg by mouth 2 (two) times daily as needed for anxiety.    [provider]  cloNIDine (CATAPRES) 0.1 MG tablet Take 0.1 mg by mouth 2 (two) times daily.    [provider]  cyclobenzaprine (FLEXERIL) 10 MG tablet Take 10 mg by mouth 3 (three) times daily as needed for muscle spasms.    [provider]  NUCYNTA ER 50 MG TB12 Take 1 tablet by mouth 2 (two) times daily. 05/04/15   [provider]  ondansetron (ZOFRAN ODT) 4 MG disintegrating tablet Allow 1-2 tablets to dissolve in your mouth every 8 hours as needed for nausea/vomiting 04/09/18   Hinda Kehr, MD    Allergies Aspirin and Penicillins  Family History  Problem Relation Age of Onset  . Breast cancer Mother 87  . Skin cancer Father   . Heart attack Father   . Colon cancer Paternal Grandmother   . Lung cancer Paternal Grandfather   . Breast cancer Cousin        maternal side early 78's    Social History Social History   Tobacco Use  . Smoking status: Former Smoker    Years: 4.00    Last attempt to quit: 12/16/2012    Years since quitting: 5.3  . Smokeless tobacco: Never Used  Substance Use Topics  . Alcohol use: No    Alcohol/week: 0.0 oz  . Drug use: No    Review of Systems Constitutional: No fever/chills Cardiovascular: Denies chest pain. Respiratory: Denies shortness of breath. Gastrointestinal: Nausea, no vomiting.   Musculoskeletal: Pain in right wrist and forearm.  Negative for neck pain.  Negative for back pain. Integumentary: Negative for lacerations Neurological: Negative for headaches, focal weakness or numbness.   ____________________________________________   PHYSICAL EXAM:  VITAL SIGNS: ED Triage Vitals [04/08/18 2319]  Enc Vitals Group  BP 133/61     Pulse Rate 95     Resp 18     Temp 98.1 F (36.7 C)     Temp src      SpO2 100 %     Weight 72.6 kg (160 lb)     Height 1.626 m (5\' 4" )     Head Circumference      Peak Flow      Pain Score 7     Pain Loc      Pain Edu?      Excl. in [place]?     Constitutional: Alert and oriented. Well appearing and in no acute distress. Eyes: Conjunctivae are normal.  Head: Atraumatic. Cardiovascular: Normal rate, regular rhythm. Good peripheral circulation. Respiratory: Normal respiratory effort.  No retractions.  Musculoskeletal: Contusion with some bruising and hematoma to the ulnar aspect of her right forearm, tender  to palpation.  She has a small degree of swelling to her index finger on the interphalangeal joint, but no restriction of her range of motion.  She has some mild tenderness to palpation of her wrist but no restriction of range of motion.  No significant swelling. Neurologic:  Normal speech and language. No gross focal neurologic deficits are appreciated.  Skin:  Skin is warm, dry and intact. No rash noted. Psychiatric: Mood and affect are normal. Speech and behavior are normal.  ____________________________________________   LABS (all labs ordered are listed, but only abnormal results are displayed)  Labs Reviewed - No data to display ____________________________________________  EKG  None - EKG not ordered by ED physician ____________________________________________  RADIOLOGY I, Hinda Kehr, personally viewed and evaluated these images (plain radiographs) as part of my medical decision making, as well as reviewing the written report by the radiologist.  ED MD interpretation: Possible small triquetrum fracture  Official radiology report(s): Dg Forearm Right  Result Date: 04/08/2018 CLINICAL DATA:  Right arm, wrist, and hand pain after punching a wall. EXAM: RIGHT FOREARM - 2 VIEW COMPARISON:  None. FINDINGS: Tiny osseous fragment over the dorsal aspect of the wrist with associated soft tissue swelling. This may represent a triquetrum avulsion fracture. The radius and ulna appear intact. IMPRESSION: Possible triquetrum avulsion fracture. Radius and ulna appear intact. Electronically Signed   By: Lucienne Capers M.D.   On: 04/08/2018 23:56   Dg Hand Complete Right  Result Date: 04/08/2018 CLINICAL DATA:  Right hand pain after punching a wall. EXAM: RIGHT HAND - COMPLETE 3+ VIEW COMPARISON:  04/16/2011 FINDINGS: There is a small osseous fragment over the dorsal aspect of the wrist with associated soft tissue swelling. This is likely an avulsion fracture off of the triquetrum bone. No  other acute fracture or dislocation is identified. Mild degenerative changes in the first metacarpal phalangeal joint. IMPRESSION: Small osseous fragment over the dorsal aspect of the wrist with soft tissue swelling suggesting a triquetrum avulsion fracture. Electronically Signed   By: Lucienne Capers M.D.   On: 04/08/2018 23:58    ____________________________________________   PROCEDURES  Critical Care performed: No   Procedure(s) performed:   .Ortho Injury Treatment Date/Time: 04/09/2018 1:54 AM Performed by: Hinda Kehr, MD Authorized by: Hinda Kehr, MD   Consent:    Consent obtained:  Verbal   Consent given by:  PatientInjury location: wrist Location details: right wrist Injury type: fracture Fracture type: triquetral Pre-procedure neurovascular assessment: neurovascularly intact Pre-procedure distal perfusion: normal Pre-procedure neurological function: normal Pre-procedure range of motion: normal  Anesthesia: Local anesthesia used: no  Patient  sedated: NoManipulation performed: no Immobilization: splint Splint type: volar short arm Supplies used: Ortho-Glass Post-procedure neurovascular assessment: post-procedure neurovascularly intact Post-procedure distal perfusion: normal Post-procedure neurological function: normal Patient tolerance: Patient tolerated the procedure well with no immediate complications Comments: Evaluated by MD after splint placement      ____________________________________________   INITIAL IMPRESSION / ASSESSMENT AND PLAN / ED COURSE  As part of my medical decision making, I reviewed the following data within the [place] notes reviewed and incorporated and Radiograph reviewed     Probable small triquetrum avulsion fracture.  Neurovascularly intact.  Treating with volar splint, over-the-counter pain medication, elevation, cryotherapy.  Providing follow-up information with Dr. Peggye Ley at the local hand  specialist.  I gave my usual and customary return precautions.  She understands and agrees with the plan.     ____________________________________________  FINAL CLINICAL IMPRESSION(S) / ED DIAGNOSES  Final diagnoses:  Contusion of right hand, initial encounter  Contusion of right forearm, initial encounter  Nondisplaced fracture of triquetrum (cuneiform) bone, right wrist, initial encounter for closed fracture     MEDICATIONS GIVEN DURING THIS VISIT:  Medications  ondansetron (ZOFRAN-ODT) disintegrating tablet 4 mg (4 mg Oral Given 04/09/18 0136)     ED Discharge Orders        Ordered    ondansetron (ZOFRAN ODT) 4 MG disintegrating tablet     04/09/18 0149       Note:  This document was prepared using Dragon voice recognition software and may include unintentional dictation errors.    Hinda Kehr, MD 04/09/18 [phone]

## 2018-04-09 NOTE — Discharge Instructions (Signed)
As we discussed, your forearm looks okay, but it appears you may have a small fracture in 1 of the bones in your wrist called the triquetrum.  We have put you in a splint and recommend that you keep it clean, dry, and in place until you can see the hand specialist next week.  Take over-the-counter pain medicine as needed.  When possible, try to keep your arm elevated and you can also use ice packs even though you have a splint in place.  Return to the emergency department if you develop new or worsening symptoms that concern you.

## 2019-07-14 ENCOUNTER — Other Ambulatory Visit: Payer: Self-pay

## 2019-07-14 MED ORDER — CLONAZEPAM 0.5 MG PO TABS: ORAL_TABLET | ORAL | 2 refills | Status: DC

## 2019-08-24 ENCOUNTER — Telehealth: Payer: Self-pay | Admitting: Internal Medicine

## 2019-08-24 ENCOUNTER — Other Ambulatory Visit: Payer: Self-pay

## 2019-08-24 MED ORDER — CYCLOBENZAPRINE HCL 10 MG PO TABS: 10.0000 mg | ORAL_TABLET | Freq: Three times a day (TID) | ORAL | 3 refills | Status: DC | PRN

## 2019-08-24 NOTE — Telephone Encounter (Signed)
PT states that [facility] can not send [place] her refill request.

## 2019-08-24 NOTE — Telephone Encounter (Signed)
Spoke with Elizabeth Peters.  Trying to get a refill of Flexeril.  Informed her I will contact [facility] & see what is going on.  [facility]ed [facility].  State they faxed the refill request to [place].  When the pharmacist recited the fax number to me, it was correct.  Correct fax number give to [facility] [place] pharmacist.  Received faxed Rx Request for Refill from [facility] for Cyclobenzaprine 10 mg 1 tab po qd prn. Request for refill sent to Dr. Roxan Hockey.  AMD

## 2019-10-04 ENCOUNTER — Ambulatory Visit: Payer: Self-pay

## 2019-10-04 ENCOUNTER — Other Ambulatory Visit: Payer: Self-pay

## 2019-10-04 ENCOUNTER — Telehealth: Payer: Self-pay | Admitting: General Practice

## 2019-10-04 ENCOUNTER — Other Ambulatory Visit: Payer: Self-pay | Admitting: Internal Medicine

## 2019-10-04 DIAGNOSIS — Z20822 Contact with and (suspected) exposure to covid-19: Secondary | ICD-10-CM

## 2019-10-04 DIAGNOSIS — F419 Anxiety disorder, unspecified: Secondary | ICD-10-CM

## 2019-10-04 NOTE — Telephone Encounter (Signed)
In the last 24 hours have you experienced any of these symptoms  Difficulty breathing  yes  Nasal congestion  yes  New cough  yes  Runny nose  no  Shortness of breath  yes  New sore throat  yes  Unexplained body aches  no  Nausea or vomiting  no  Diarrhea  no  Loss of taste or smell  no  Fever (temp>100 F/37.8 C) or chills  98.42  No chills  Exposure:   Have you been in close contact with someone with confirmed diagnosis of COVID or person under going testing in past 14 days?  yes   Have you been tested for COVID? If yes date, location, results in known  no   Have you been living in the same home as a person with confirmed diagnosis of COVID or a person undergoing testing? (household contact)  no   Have you been diagnosed with COVID or are you waiting on test results?  no   Have you traveled anywhere in the past 4 weeks? If yes where   No  Retired  Lives with girlfriend she has no sx  Sx started Saturday. Pt was around a post testing person last weekend. Currently taking Flonez. She is going for test today.  She had an appt today for her rx refill. She is out completely. Wanted to know if she can get a 12mth refill.

## 2019-10-06 LAB — NOVEL CORONAVIRUS, NAA: SARS-CoV-2, NAA: NOT DETECTED

## 2019-12-06 ENCOUNTER — Other Ambulatory Visit: Payer: Self-pay | Admitting: Internal Medicine

## 2019-12-07 ENCOUNTER — Ambulatory Visit: Payer: Self-pay

## 2019-12-14 ENCOUNTER — Other Ambulatory Visit: Payer: Self-pay

## 2019-12-14 ENCOUNTER — Ambulatory Visit: Payer: Self-pay | Admitting: Physician Assistant

## 2019-12-14 DIAGNOSIS — F419 Anxiety disorder, unspecified: Secondary | ICD-10-CM

## 2019-12-14 MED ORDER — CLONAZEPAM 0.5 MG PO TABS: 0.5000 mg | ORAL_TABLET | Freq: Two times a day (BID) | ORAL | 1 refills | Status: DC | PRN

## 2019-12-14 MED ORDER — CYCLOBENZAPRINE HCL 10 MG PO TABS: 10.0000 mg | ORAL_TABLET | Freq: Three times a day (TID) | ORAL | 3 refills | Status: DC | PRN

## 2019-12-14 NOTE — Progress Notes (Signed)
   Subjective:Prescripton refill    Patient ID: Elizabeth Peters, female    DOB: [birth year], 38 y.o.   MRN: [id number]  HPI  Patient request refill of Clonazepam and Flexeril. Patient request evaluation of nodular lesion right posterior neck. Patient states notice tingling in right upper extremity with onset of lesion.  Review of Systems Anxiety and muscle spasma.    Objective:   Physical Exam Nodular lesion right posterior neck.        Assessment & Plan:Lipoma and medication refill.  Refilled Clonazepam and Flexeril.  Consult to Surgical Clinic.

## 2019-12-29 ENCOUNTER — Telehealth: Payer: Self-pay | Admitting: Registered Nurse

## 2019-12-29 ENCOUNTER — Encounter: Payer: Self-pay | Admitting: Registered Nurse

## 2019-12-29 NOTE — Telephone Encounter (Signed)
Received fax from consult [place] sent patient to general surgery for neck cyst evaluation.  General surgery recommended orthopedics/neurosurgery as patient radicular symptoms not expected to arise from neck lipoma.  Patient with history cervical fusion C6-7/DDD cervical/chronic pain.  Patient seen 2018 at [place] [facility] neurosurgery Dr Talbot Grumbling per chart review.  History of skull surgery/craniotomy with mesh also.  Please contact patient to determine if she wants to follow up with her previous provider or get second opinion from new provider.

## 2019-12-30 NOTE — Telephone Encounter (Signed)
12/30/2019  11:00 am Contacted Elizabeth Peters [phone]) & informed her  Elizabeth Fraction, NP-C (Interim Provider) reviewed Elizabeth Peters office notes (12/23/19).  Asked if she needed assistance with referral (Ortho/Neuro Consult) as recommended by Elizabeth Peters.  States she doesn't want to do anything at this time, now that she knows that the lipoma on her neck isn't something bad.  Also, she said she wasn't sure if the neurosurgeon she saw at [facility] is still there.  Said Elizabeth Peters office isn't handling the referral & advised her to follow-up with PCM.  She said if she needs our assistance she will contact us when she's ready.  AMD

## 2019-12-30 NOTE — Telephone Encounter (Signed)
Noted patient does not want to see orthopedics or neurosurgery at this time.  Was concerned about "lipoma" but now that was told benign by general sugery does not desire any further follow up at this time.  Patient should follow up for re-evaluation if arm/hand weakness/dropping things or size of lipoma continually enlarging or causing pain.

## 2020-01-12 ENCOUNTER — Other Ambulatory Visit: Payer: Self-pay | Admitting: Physician Assistant

## 2020-01-12 ENCOUNTER — Other Ambulatory Visit: Payer: Self-pay

## 2020-01-12 DIAGNOSIS — F419 Anxiety disorder, unspecified: Secondary | ICD-10-CM

## 2020-01-12 MED ORDER — CLONAZEPAM 0.5 MG PO TABS: 0.5000 mg | ORAL_TABLET | Freq: Two times a day (BID) | ORAL | 1 refills | Status: DC | PRN

## 2020-01-12 MED ORDER — CLONAZEPAM 0.5 MG PO TABS: ORAL_TABLET | ORAL | 2 refills | Status: DC

## 2020-03-06 ENCOUNTER — Telehealth: Payer: Self-pay | Admitting: General Practice

## 2020-03-06 DIAGNOSIS — F419 Anxiety disorder, unspecified: Secondary | ICD-10-CM

## 2020-03-06 NOTE — Telephone Encounter (Signed)
Needs refill for clonazepam. Switched Pharmacy to [facility] in [place]

## 2020-03-06 NOTE — Telephone Encounter (Signed)
Elizabeth Peters needs to schedule an office visit to get a refill for Klonopin.  Her last office visit was 12/14/2019 & received a refill.  Med was refilled on 01/10/20 with two refills.    AMD

## 2020-03-07 DIAGNOSIS — M76891 Other specified enthesopathies of right lower limb, excluding foot: Secondary | ICD-10-CM | POA: Insufficient documentation

## 2020-03-07 NOTE — Telephone Encounter (Signed)
She is scheduled for Thursday.

## 2020-03-08 NOTE — Addendum Note (Signed)
Addended by: Aliene Altes on: 03/08/2020 10:20 AM   Modules accepted: Orders

## 2020-03-09 ENCOUNTER — Encounter: Payer: Self-pay | Admitting: Physician Assistant

## 2020-03-09 ENCOUNTER — Other Ambulatory Visit: Payer: Self-pay

## 2020-03-09 ENCOUNTER — Ambulatory Visit: Payer: Self-pay | Admitting: Physician Assistant

## 2020-03-09 DIAGNOSIS — F419 Anxiety disorder, unspecified: Secondary | ICD-10-CM

## 2020-03-09 MED ORDER — CLONAZEPAM 0.5 MG PO TABS: 0.5000 mg | ORAL_TABLET | Freq: Two times a day (BID) | ORAL | 2 refills | Status: DC | PRN

## 2020-03-09 MED ORDER — CYCLOBENZAPRINE HCL 10 MG PO TABS: 10.0000 mg | ORAL_TABLET | Freq: Every day | ORAL | 2 refills | Status: DC

## 2020-03-09 NOTE — Progress Notes (Signed)
   Subjective: Medication refill for anxiety.    Patient ID: Elizabeth Peters, female    DOB: 02-08-1981, 39 y.o.   MRN: [id number]  HPI Patient presents with request for refill of medication for anxiety.  Patient is currently taking Catapres 0.5 mg twice a day as needed.  Patient voices no complaints with medications.   Review of Systems    Anxiety Objective:   Physical Exam Patient appears no acute distress.  HEENT is unremarkable.  Neck is supple adenopathy bruits.  Lungs clear to auscultation.  Heart regular rate and rhythm.       Assessment & Plan: Anxiety  Prescription for Klonopin renewed.  Patient also given a prescription renewal for Flexeril which she takes only at night.  Follow-up 3 months.

## 2020-05-30 DIAGNOSIS — F331 Major depressive disorder, recurrent, moderate: Secondary | ICD-10-CM | POA: Insufficient documentation

## 2020-05-30 DIAGNOSIS — F411 Generalized anxiety disorder: Secondary | ICD-10-CM | POA: Insufficient documentation

## 2020-05-30 DIAGNOSIS — Z981 Arthrodesis status: Secondary | ICD-10-CM | POA: Insufficient documentation

## 2020-05-30 DIAGNOSIS — Z803 Family history of malignant neoplasm of breast: Secondary | ICD-10-CM | POA: Insufficient documentation

## 2020-05-30 DIAGNOSIS — F439 Reaction to severe stress, unspecified: Secondary | ICD-10-CM | POA: Insufficient documentation

## 2020-10-11 DIAGNOSIS — G8929 Other chronic pain: Secondary | ICD-10-CM | POA: Insufficient documentation

## 2020-10-11 DIAGNOSIS — M5030 Other cervical disc degeneration, unspecified cervical region: Secondary | ICD-10-CM | POA: Insufficient documentation

## 2020-10-11 DIAGNOSIS — G4700 Insomnia, unspecified: Secondary | ICD-10-CM | POA: Insufficient documentation

## 2020-10-11 DIAGNOSIS — R519 Headache, unspecified: Secondary | ICD-10-CM | POA: Insufficient documentation

## 2020-11-21 ENCOUNTER — Other Ambulatory Visit: Payer: Self-pay | Admitting: General Surgery

## 2020-11-21 DIAGNOSIS — Z1211 Encounter for screening for malignant neoplasm of colon: Secondary | ICD-10-CM

## 2021-06-19 DIAGNOSIS — M5416 Radiculopathy, lumbar region: Secondary | ICD-10-CM | POA: Diagnosis not present

## 2021-06-26 DIAGNOSIS — R634 Abnormal weight loss: Secondary | ICD-10-CM | POA: Diagnosis not present

## 2021-06-26 DIAGNOSIS — Z6828 Body mass index (BMI) 28.0-28.9, adult: Secondary | ICD-10-CM | POA: Diagnosis not present

## 2021-06-26 DIAGNOSIS — R630 Anorexia: Secondary | ICD-10-CM | POA: Diagnosis not present

## 2021-09-17 ENCOUNTER — Other Ambulatory Visit: Payer: Self-pay

## 2021-09-17 ENCOUNTER — Ambulatory Visit
Admission: EM | Admit: 2021-09-17 | Discharge: 2021-09-17 | Disposition: A | Discharge status: Home / Self Care | Payer: BLUE CROSS/BLUE SHIELD | Attending: Emergency Medicine | Admitting: Emergency Medicine

## 2021-09-17 ENCOUNTER — Ambulatory Visit (INDEPENDENT_AMBULATORY_CARE_PROVIDER_SITE_OTHER): Payer: BLUE CROSS/BLUE SHIELD

## 2021-09-17 DIAGNOSIS — R109 Unspecified abdominal pain: Secondary | ICD-10-CM

## 2021-09-17 DIAGNOSIS — R319 Hematuria, unspecified: Secondary | ICD-10-CM | POA: Insufficient documentation

## 2021-09-17 DIAGNOSIS — Z886 Allergy status to analgesic agent status: Secondary | ICD-10-CM | POA: Diagnosis not present

## 2021-09-17 DIAGNOSIS — R110 Nausea: Secondary | ICD-10-CM | POA: Diagnosis not present

## 2021-09-17 DIAGNOSIS — M545 Low back pain, unspecified: Secondary | ICD-10-CM | POA: Diagnosis not present

## 2021-09-17 DIAGNOSIS — Z87891 Personal history of nicotine dependence: Secondary | ICD-10-CM | POA: Diagnosis not present

## 2021-09-17 DIAGNOSIS — Z880 Allergy status to penicillin: Secondary | ICD-10-CM | POA: Insufficient documentation

## 2021-09-17 DIAGNOSIS — Z79899 Other long term (current) drug therapy: Secondary | ICD-10-CM | POA: Insufficient documentation

## 2021-09-17 LAB — COMPREHENSIVE METABOLIC PANEL
ALT: 21 U/L (ref 0–44)
AST: 20 U/L (ref 15–41)
Albumin: 4.2 g/dL (ref 3.5–5.0)
Alkaline Phosphatase: 63 U/L (ref 38–126)
Anion gap: 2 — ABNORMAL LOW (ref 5–15)
BUN: 7 mg/dL (ref 6–20)
CO2: 27 mmol/L (ref 22–32)
Calcium: 8.9 mg/dL (ref 8.9–10.3)
Chloride: 105 mmol/L (ref 98–111)
Creatinine, Ser: 0.88 mg/dL (ref 0.44–1.00)
GFR, Estimated: >60 mL/min (ref >60)
Glucose, Bld: 110 mg/dL — ABNORMAL HIGH (ref 70–99)
Potassium: 3.6 mmol/L (ref 3.5–5.1)
Sodium: 134 mmol/L — ABNORMAL LOW (ref 135–145)
Total Bilirubin: 0.7 mg/dL (ref 0.3–1.2)
Total Protein: 7.5 g/dL (ref 6.5–8.1)

## 2021-09-17 LAB — URINALYSIS, COMPLETE (UACMP) WITH MICROSCOPIC
Bilirubin Urine: NEGATIVE
Glucose, UA: NEGATIVE mg/dL
Hgb urine dipstick: NEGATIVE
Ketones, ur: NEGATIVE mg/dL
Nitrite: NEGATIVE
Protein, ur: NEGATIVE mg/dL
Specific Gravity, Urine: 1.02 (ref 1.005–1.030)
pH: 7 (ref 5.0–8.0)

## 2021-09-17 LAB — CBC WITH DIFFERENTIAL/PLATELET
Abs Immature Granulocytes: 0.01 10*3/uL (ref 0.00–0.07)
Basophils Absolute: 0 10*3/uL (ref 0.0–0.1)
Basophils Relative: 1 %
Eosinophils Absolute: 0 10*3/uL (ref 0.0–0.5)
Eosinophils Relative: 1 %
HCT: 35.1 % — ABNORMAL LOW (ref 36.0–46.0)
Hemoglobin: 12.2 g/dL (ref 12.0–15.0)
Immature Granulocytes: 0 %
Lymphocytes Relative: 26 %
Lymphs Abs: 1.8 10*3/uL (ref 0.7–4.0)
MCH: 28.8 pg (ref 26.0–34.0)
MCHC: 34.8 g/dL (ref 30.0–36.0)
MCV: 83 fL (ref 80.0–100.0)
Monocytes Absolute: 0.4 10*3/uL (ref 0.1–1.0)
Monocytes Relative: 5 %
Neutro Abs: 4.8 10*3/uL (ref 1.7–7.7)
Neutrophils Relative %: 67 %
Platelets: 266 10*3/uL (ref 150–400)
RBC: 4.23 MIL/uL (ref 3.87–5.11)
RDW: 13 % (ref 11.5–15.5)
WBC: 7.1 10*3/uL (ref 4.0–10.5)
nRBC: 0 % (ref 0.0–0.2)

## 2021-09-17 LAB — LIPASE, BLOOD: Lipase: 24 U/L (ref 11–51)

## 2021-09-17 NOTE — Discharge Instructions (Addendum)
Your blood work and x-rays were unrevealing as to the source of the blood in your urine.  Return for reevaluation if your symptoms return or follow-up with your primary care provider for further evaluation.

## 2021-09-17 NOTE — ED Triage Notes (Signed)
Pt presents with complaints of hematuria and bilateral lower flank pain that started last week. Reports multiple episodes of hematuria and back pain since. Concerned for uti.

## 2021-09-17 NOTE — ED Provider Notes (Signed)
[facility]    CSN: [id number] Arrival date & time: 09/17/21  0950      History   Chief Complaint Chief Complaint  Patient presents with   Hematuria    HPI Elizabeth Peters is a 40 y.o. female.   HPI  40 year old female here for evaluation of bloody urine.  Patient reports that she developed hematuria 5 to 6 days ago.  She initially thought that she is started her menstrual cycle and started utilizing tampons.  She noticed that when she extracted her tampon there was no bleeding.  The hematuria has been intermittent with most recent episode being yesterday.  She states that she has not seen blood clots and is no blood when she wipes.  She has not had any painful urination, urinary urgency or frequency, cloudiness to the urine, fever, or vomiting.  She denies vaginal itching, discharge, or pain.  She states that she has had having low back pain that predates the onset of the hematuria and has not increased since the presentation there is no symptoms.  She states that she is experiencing [place] the area of her clitoris.  She is also complaining of nausea.  She has had weight loss and decreased appetite but that is not a new symptom and has been evaluated by her primary care provider.  Past Medical History:  Diagnosis Date   Anxiety    Brain tumor (benign) ([place]) 2011   Chronic pain due to injury    Endometriosis    GERD (gastroesophageal reflux disease)     Patient Active Problem List   Diagnosis Date Noted   Chronic headaches 10/11/2020   DDD (degenerative disc disease), cervical 10/11/2020   Insomnia 10/11/2020   Enthesopathy of right hip 03/07/2020   Axillary pain 06/08/2015   Discharge from left nipple 06/08/2015   Mastalgia 06/08/2015    Past Surgical History:  Procedure Laterality Date   BRAIN SURGERY  2011   tumor   [place] OF UTERUS  2007   neck fusion     SHOULDER SURGERY Left 2011   [place] SURGERY  2013   neck fulsion    TONSILLECTOMY  2008    OB History   No obstetric history on file.      Home Medications    Prior to Admission medications   Medication Sig Start Date End Date Taking? Authorizing Provider  clonazePAM (KLONOPIN) 0.5 MG tablet TAKE ONE TABLET ONCE TO TWICE DAILY AS NEEDED 01/12/20   Sable Feil, PA-C  clonazePAM (KLONOPIN) 0.5 MG tablet Take 1 tablet (0.5 mg total) by mouth 2 (two) times daily as needed for anxiety. 03/09/20   Sable Feil, PA-C  cloNIDine (CATAPRES) 0.1 MG tablet Take 0.1 mg by mouth 2 (two) times daily.    [provider]  cyclobenzaprine (FLEXERIL) 10 MG tablet Take 1 tablet (10 mg total) by mouth at bedtime. 03/09/20   Sable Feil, PA-C  NUCYNTA ER 50 MG TB12 Take 1 tablet by mouth 2 (two) times daily. 05/04/15   [provider]  ondansetron (ZOFRAN ODT) 4 MG disintegrating tablet Allow 1-2 tablets to dissolve in your mouth every 8 hours as needed for nausea/vomiting Patient not taking: Reported on 12/14/2019 04/09/18   Hinda Kehr, MD    Family History Family History  Problem Relation Age of Onset   Breast cancer Mother 65   Skin cancer Father    Heart attack Father    Colon cancer Paternal Grandmother  Lung cancer Paternal Grandfather    Breast cancer Cousin        maternal side early 25's    Social History Social History   Tobacco Use   Smoking status: Former    Years: 4.00    Types: Cigarettes    Quit date: 12/16/2012    Years since quitting: 8.7   Smokeless tobacco: Never  Substance Use Topics   Alcohol use: No    Alcohol/week: 0.0 standard drinks   Drug use: No     Allergies   Aspirin and Penicillins   Review of Systems Review of Systems  Constitutional:  Negative for activity change, appetite change and fever.  Gastrointestinal:  Positive for abdominal pain and nausea. Negative for diarrhea and vomiting.  Genitourinary:  Positive for hematuria. Negative for dysuria, frequency, urgency, vaginal bleeding,  vaginal discharge and vaginal pain.  Musculoskeletal:  Positive for back pain.  Skin:  Negative for rash.  Hematological: Negative.   Psychiatric/Behavioral: Negative.      Physical Exam Triage Vital Signs ED Triage Vitals  Enc Vitals Group     BP 09/17/21 1046 (!) 142/63     Pulse Rate 09/17/21 1046 68     Resp 09/17/21 1046 18     Temp 09/17/21 1046 98 F (36.7 C)     Temp src --      SpO2 09/17/21 1046 100 %     Weight --      Height --      Head Circumference --      Peak Flow --      Pain Score 09/17/21 1043 4     Pain Loc --      Pain Edu? --      Excl. in [place]? --    No data found.  Updated Vital Signs BP (!) 142/63   Pulse 68   Temp 98 F (36.7 C)   Resp 18   LMP 09/06/2021   SpO2 100%   Visual Acuity Right Eye Distance:   Left Eye Distance:   Bilateral Distance:    Right Eye Near:   Left Eye Near:    Bilateral Near:     Physical Exam Vitals and nursing note reviewed.  Constitutional:      General: She is not in acute distress.    Appearance: Normal appearance. She is not ill-appearing.  HENT:     Head: Normocephalic and atraumatic.  Cardiovascular:     Rate and Rhythm: Normal rate and regular rhythm.     Pulses: Normal pulses.     Heart sounds: Normal heart sounds. No murmur heard.   No gallop.  Pulmonary:     Effort: Pulmonary effort is normal.     Breath sounds: Normal breath sounds. No wheezing, rhonchi or rales.  Abdominal:     Palpations: Abdomen is soft.     Tenderness: There is abdominal tenderness. There is no right CVA tenderness, left CVA tenderness, guarding or rebound.  Skin:    General: Skin is warm and dry.     Capillary Refill: Capillary refill takes less than 2 seconds.     Findings: No erythema or rash.  Neurological:     General: No focal deficit present.     Mental Status: She is alert and oriented to person, place, and time.  Psychiatric:        Mood and Affect: Mood normal.        Behavior: Behavior normal.  Thought Content: Thought content normal.        Judgment: Judgment normal.     UC Treatments / Results  Labs (all labs ordered are listed, but only abnormal results are displayed) Labs Reviewed  URINALYSIS, COMPLETE (UACMP) WITH MICROSCOPIC - Abnormal; Notable for the following components:      Result Value   Leukocytes,Ua TRACE (*)    Bacteria, UA RARE (*)    All other components within normal limits  CBC WITH DIFFERENTIAL/PLATELET - Abnormal; Notable for the following components:   HCT 35.1 (*)    All other components within normal limits  COMPREHENSIVE METABOLIC PANEL - Abnormal; Notable for the following components:   Sodium 134 (*)    Glucose, Bld 110 (*)    Anion gap 2 (*)    All other components within normal limits  LIPASE, BLOOD    EKG   Radiology DG Abdomen 1 View  Result Date: 09/17/2021 CLINICAL DATA:  Abdominal pain.  Hematuria.  Flank pain. EXAM: ABDOMEN - 1 VIEW COMPARISON:  None. FINDINGS: Bowel gas pattern is within normal limits. No abnormal calcifications are seen overlying the location of either kidney, the course of either ureter or the bladder. Ordinary mild arthritic changes affect the sacroiliac joints. IMPRESSION: Negative examination. No evidence of renal stone disease by radiography. Electronically Signed   By: Nelson Chimes M.D.   On: 09/17/2021 11:53    Procedures Procedures (including critical care time)  Medications Ordered in UC Medications - No data to display  Initial Impression / Assessment and Plan / UC Course  I have reviewed the triage vital signs and the nursing notes.  Pertinent labs & imaging results that were available during my care of the patient were reviewed by me and considered in my medical decision making (see chart for details).  Patient is a nontoxic-appearing 40 year old female here for evaluation of hematuria and bilateral low back pain.  Patient reports that her symptoms started 5 to 6 days ago and consist of  intermittent hematuria without clots and pain in the lower lumbar paraspinous regions bilaterally.  This is not associated with other typical UTI symptoms such as dysuria, urgency, or frequency.  No cloudiness to the urine.  No fever, or vomiting.  No vaginal complaints.  Patient's physical exam reveals a benign cardiopulmonary exam with clear lung sounds in all fields.  Patient has no CVA tenderness on exam.  Patient does have bilateral lower lumbar paraspinous tenderness that does not extend into the buttock area.  No midline spinous tenderness.  Patient reports she has an annular tear at L4-L5.  Patient's abdominal exam reveals a flat abdomen with positive bowel sounds all 4 quadrants.  Patient has right upper quadrant tenderness that refers to the epigastrium with palpation.  Left upper quadrant is unremarkable as is the left lower quadrant.  Patient also has right lower quadrant tenderness and tenderness over McBurney's point.  Patient indicates that when she lays flat she experiences pain in the right lower quadrant/inguinal area.  Patient has not had any fever that she is aware of.  She does report that since the beginning of the year she has been experiencing weight loss and decreased appetite.  She states that initially she had pain in the right upper quadrant that radiated to the epigastrium and through to her back when she ate so she decreased her oral fluid and food intake which helped the pain.  She did not seek care for this complaint.  She states that she  does also experience some intermittent constipation as a result of her decreased p.o. intake.  Urinalysis collected at triage.  Will check CBC, CMP, and lipase.  We will also check KUB.  Considering CT scan abdomen pelvis.  Urinalysis shows trace leukocytes and rare bacteria but is otherwise unremarkable.  KUB of the abdomen independently reviewed and evaluated by me.  Impression: There is a large stool burden in the ascending and transverse  colon with nonspecific air-fluid levels throughout the remainder of the transverse colon to the splenic flexure.  No other abnormalities visualized.  Radiology overread is pending. Radiology impression is negative exam.  CBC is unremarkable.  CMP shows a mild degree sodium of 134 and a glucose of 110 but is otherwise unremarkable.  Transaminases are normal.  BUN/creatinine were normal as well.  Lipase is 24.  Origin of patient's intermittent hematuria is unclear there is no evidence of infection or liver inflammation secondary to gallbladder disease.  I discussed the lab work and radiologic findings with the patient.  She is in no acute distress at this point.  I offered to pursue a prior authorization for an outpatient CT scan of her abdomen pelvis at the hospital today and the patient declined.  She states that she will follow-up with her primary care doctor or return for new or worsening symptoms.   Final Clinical Impressions(s) / UC Diagnoses   Final diagnoses:  Hematuria, unspecified type     Discharge Instructions      Your blood work and x-rays were unrevealing as to the source of the blood in your urine.  Return for reevaluation if your symptoms return or follow-up with your primary care provider for further evaluation.     ED Prescriptions   None    PDMP not reviewed this encounter.   Margarette Canada, NP 09/17/21 1340

## 2021-10-04 ENCOUNTER — Encounter: Payer: Self-pay | Admitting: Family Medicine

## 2021-10-04 ENCOUNTER — Ambulatory Visit (INDEPENDENT_AMBULATORY_CARE_PROVIDER_SITE_OTHER): Payer: BLUE CROSS/BLUE SHIELD | Admitting: Family Medicine

## 2021-10-04 ENCOUNTER — Other Ambulatory Visit: Payer: Self-pay

## 2021-10-04 VITALS — BP 120/70 | HR 64 | Ht 65.0 in | Wt 153.0 lb

## 2021-10-04 DIAGNOSIS — Z7689 Persons encountering health services in other specified circumstances: Secondary | ICD-10-CM

## 2021-10-04 DIAGNOSIS — R6881 Early satiety: Secondary | ICD-10-CM | POA: Diagnosis not present

## 2021-10-04 DIAGNOSIS — M546 Pain in thoracic spine: Secondary | ICD-10-CM | POA: Diagnosis not present

## 2021-10-04 DIAGNOSIS — Z8742 Personal history of other diseases of the female genital tract: Secondary | ICD-10-CM | POA: Diagnosis not present

## 2021-10-04 DIAGNOSIS — R634 Abnormal weight loss: Secondary | ICD-10-CM

## 2021-10-04 DIAGNOSIS — Z8659 Personal history of other mental and behavioral disorders: Secondary | ICD-10-CM | POA: Diagnosis not present

## 2021-10-04 NOTE — Progress Notes (Signed)
Date:  10/04/2021   Name:  Elizabeth Peters   DOB:  1981/02/09   MRN:  [id number]   Chief Complaint: Establish Care  Patient is a 40 year old female who presents for a establish care exam. The patient reports the following problems: back pain. Health maintenance has been reviewed up to date.    Back Pain This is a chronic problem. The current episode started more than 1 year ago. The problem occurs constantly. The problem has been gradually worsening since onset. The pain is present in the thoracic spine. The quality of the pain is described as aching. The pain is moderate. The symptoms are aggravated by bending, twisting and sitting. Associated symptoms include numbness, pelvic pain and weakness. Pertinent negatives include no paresthesias.  Pelvic Pain The patient's primary symptoms include pelvic pain. This is a chronic problem. The current episode started more than 1 year ago. The problem has been gradually worsening. Associated symptoms include back pain. Pertinent negatives include no constipation.   Lab Results  Component Value Date   CREATININE 0.88 09/17/2021   BUN 7 09/17/2021   NA 134 (L) 09/17/2021   K 3.6 09/17/2021   CL 105 09/17/2021   CO2 27 09/17/2021   No results found for: CHOL, HDL, LDLCALC, LDLDIRECT, TRIG, CHOLHDL No results found for: TSH No results found for: HGBA1C Lab Results  Component Value Date   WBC 7.1 09/17/2021   HGB 12.2 09/17/2021   HCT 35.1 (L) 09/17/2021   MCV 83.0 09/17/2021   PLT 266 09/17/2021   Lab Results  Component Value Date   ALT 21 09/17/2021   AST 20 09/17/2021   ALKPHOS 63 09/17/2021   BILITOT 0.7 09/17/2021     Review of Systems  Gastrointestinal:  Negative for constipation.  Genitourinary:  Positive for pelvic pain.  Musculoskeletal:  Positive for back pain.  Neurological:  Positive for weakness and numbness. Negative for paresthesias.   Patient Active Problem List   Diagnosis Date Noted   Chronic headaches  10/11/2020   DDD (degenerative disc disease), cervical 10/11/2020   Insomnia 10/11/2020   Enthesopathy of right hip 03/07/2020   Axillary pain 06/08/2015   Discharge from left nipple 06/08/2015   Mastalgia 06/08/2015    Allergies  Allergen Reactions   Aspirin Anaphylaxis   Penicillins Anaphylaxis    Past Surgical History:  Procedure Laterality Date   BRAIN SURGERY  2011   tumor   DILATION AND CURETTAGE OF UTERUS  2007   neck fusion     SHOULDER SURGERY Left 2011   SPINE SURGERY  2013   neck fulsion   TONSILLECTOMY  2008    Social History   Tobacco Use   Smoking status: Former    Years: 4.00    Types: Cigarettes    Quit date: 12/16/2012    Years since quitting: 8.8   Smokeless tobacco: Never  Substance Use Topics   Alcohol use: No    Alcohol/week: 0.0 standard drinks   Drug use: No     Medication list has been reviewed and updated.  No outpatient medications have been marked as taking for the 10/04/21 encounter (Office Visit) with Juline Patch, MD.    [facility] 2/9 Scores 10/04/2021  PHQ - 2 Score 3  PHQ- 9 Score 13    GAD 7 : Generalized Anxiety Score 10/04/2021  Nervous, Anxious, on Edge 3  Control/stop worrying 0  Worry too much - different things 0  Trouble relaxing 1  Restless  0  Easily annoyed or irritable 3  Afraid - awful might happen 0  Total GAD 7 Score 7  Anxiety Difficulty Not difficult at all    BP Readings from Last 3 Encounters:  10/04/21 120/70  09/17/21 (!) 142/63  03/09/20 139/88    Physical Exam  Wt Readings from Last 3 Encounters:  10/04/21 153 lb (69.4 kg)  03/09/20 177 lb (80.3 kg)  12/14/19 171 lb 12.8 oz (77.9 kg)    BP 120/70   Pulse 64   Ht 5\' 5"  (1.651 m)   Wt 153 lb (69.4 kg)   LMP 09/06/2021   BMI 25.46 kg/m   Assessment and Plan:  1. Establishing care with new doctor, encounter for Patient establishing care with new physician.  Review of General concerns as noted below.  2. Thoracic spine pain New  onset.  Persistent.  Has been seen by [facility] spine center and had MRI position but did not have insurance at the time.  I have encouraged her to return and reinsert at the level of her evaluation that this would be better than starting all over at a new orthopedic/neurosurgical concern.  3. History of endometriosis Patient has a history of endometriosis and was offered hysterectomy in 2007 and declined at that time.  Now she is continued to have symptomatology of her endometriosis would like to see GYN about possible options. - Ambulatory referral to Gynecology  4. Early satiety Patient says she has had recent weight loss and has no appetite and whether or not there is medication that could be used for this as of for the most part that this sees medications are possibilities after we have ruled out reasons for her lack of appetite and early satiety.  Referral has been made to GI for evaluation thereupon. - Ambulatory referral to Gastroenterology  5. History of depression Chronic.  Used to be on Zoloft 100 mg.  Currently has a PHQ of 13 with a gad score of 7.  We discussed diagnosis of exclusion and that we will approach her satiety and weight loss from aspect of potential causes prior to discussion of resuming antidepressant whether it be an SSRI or the possibility of Cymbalta given her history of spine pain.  6. Weight loss Patient notes that she is weight loss but from what I can tell she is returned to roughly what her weight was in 2016.  From 2018 she gained 13 pounds and was noted to be 160 pounds in 2019.  She went back up to 177 pounds in March 2021 but is lost 24 pounds presumably from her lack of appetite.  We will evaluate this from the concern of her GI and if this is unremarkable then look at other reasons for weight loss. - Ambulatory referral to Gastroenterology

## 2021-10-05 ENCOUNTER — Telehealth: Payer: Self-pay

## 2021-10-05 NOTE — Telephone Encounter (Signed)
[facility] referring for History of endometriosis. Called and left voicemail for patient to call back to be scheduled.

## 2021-10-08 ENCOUNTER — Ambulatory Visit (INDEPENDENT_AMBULATORY_CARE_PROVIDER_SITE_OTHER): Payer: BLUE CROSS/BLUE SHIELD | Admitting: Obstetrics & Gynecology

## 2021-10-08 ENCOUNTER — Encounter: Payer: Self-pay | Admitting: Obstetrics & Gynecology

## 2021-10-08 ENCOUNTER — Other Ambulatory Visit: Payer: Self-pay

## 2021-10-08 ENCOUNTER — Other Ambulatory Visit (HOSPITAL_COMMUNITY)
Admission: RE | Admit: 2021-10-08 | Discharge: 2021-10-08 | Disposition: A | Discharge status: Home / Self Care | Payer: BLUE CROSS/BLUE SHIELD | Source: Ambulatory Visit | Attending: Obstetrics & Gynecology | Admitting: Obstetrics & Gynecology

## 2021-10-08 VITALS — BP 100/60 | Ht 65.0 in | Wt 152.0 lb

## 2021-10-08 DIAGNOSIS — K5909 Other constipation: Secondary | ICD-10-CM

## 2021-10-08 DIAGNOSIS — R102 Pelvic and perineal pain unspecified side: Secondary | ICD-10-CM

## 2021-10-08 DIAGNOSIS — N809 Endometriosis, unspecified: Secondary | ICD-10-CM | POA: Diagnosis not present

## 2021-10-08 DIAGNOSIS — Z124 Encounter for screening for malignant neoplasm of cervix: Secondary | ICD-10-CM | POA: Diagnosis not present

## 2021-10-08 DIAGNOSIS — N926 Irregular menstruation, unspecified: Secondary | ICD-10-CM

## 2021-10-08 NOTE — Patient Instructions (Signed)
Total Laparoscopic Hysterectomy A total laparoscopic hysterectomy is a minimally invasive surgery to remove the uterus and cervix. The fallopian tubes and ovaries can also be removed during this surgery, if necessary. This procedure may be done to treat problems such as: Growths in the uterus (uterine fibroids) that are not cancer but cause symptoms. A condition that causes the lining of the uterus to grow in other areas (endometriosis). Problems with pelvic support. Cancer of the cervix, ovaries, uterus, or tissue that lines the uterus (endometrium). Excessive bleeding in the uterus. After this procedure, you will no longer be able to have a baby, and you will no longer have a menstrual period. Tell a health care provider about: Any allergies you have. All medicines you are taking, including vitamins, herbs, eye drops, creams, and over-the-counter medicines. Any problems you or family members have had with anesthetic medicines. Any blood disorders you have. Any surgeries you have had. Any medical conditions you have. Whether you are pregnant or may be pregnant. What are the risks? Generally, this is a safe procedure. However, problems may occur, including: Infection. Bleeding. Blood clots in the legs or lungs. Allergic reactions to medicines. Damage to nearby structures or organs. Having to change from this surgery to one in which a large incision is made in the abdomen (abdominal hysterectomy). What happens before the procedure? Staying hydrated Follow instructions from your health care provider about hydration, which may include: Up to 2 hours before the procedure - you may continue to drink clear liquids, such as water, clear fruit juice, black coffee, and plain tea.  Eating and drinking restrictions Follow instructions from your health care provider about eating and drinking, which may include: 8 hours before the procedure - stop eating heavy meals or foods, such as meat, fried  foods, or fatty foods. 6 hours before the procedure - stop eating light meals or foods, such as toast or cereal. 6 hours before the procedure - stop drinking milk or drinks that contain milk. 2 hours before the procedure - stop drinking clear liquids. Medicines Ask your health care provider about: Changing or stopping your regular medicines. This is especially important if you are taking diabetes medicines or blood thinners. Taking medicines such as aspirin and ibuprofen. These medicines can thin your blood. Do not take these medicines unless your health care provider tells you to take them. Taking over-the-counter medicines, vitamins, herbs, and supplements. You may be asked to take medicine that helps you have a bowel movement (laxative) to prevent constipation. General instructions If you were asked to do bowel preparation before the procedure, follow instructions from your health care provider. This procedure can affect the way you feel about yourself. Talk with your health care provider about the physical and emotional changes hysterectomy may cause. Do not use any products that contain nicotine or tobacco for at least 4 weeks before the procedure. These products include cigarettes, chewing tobacco, and vaping devices, such as e-cigarettes. If you need help quitting, ask your health care provider. Plan to have a responsible adult take you home from the hospital or clinic. Plan to have a responsible adult care for you for the time you are told after you leave the hospital or clinic. This is important. Surgery safety Ask your health care provider: How your surgery site will be marked. What steps will be taken to help prevent infection. These may include: Removing hair at the surgery site. Washing skin with a germ-killing soap. Receiving antibiotic medicine. What happens during the  procedure? An IV will be inserted into one of your veins. You will be given one or more of the following: A  medicine to help you relax (sedative). A medicine to make you fall asleep (general anesthetic). A medicine to numb the area (local anesthetic). A medicine that is injected into your spine to numb the area below and slightly above the injection site (spinal anesthetic). A medicine that is injected into an area of your body to numb everything below the injection site (regional anesthetic). A gas will be used to inflate your abdomen. This will allow your surgeon to look inside your abdomen and do the surgery. Three or four small incisions will be made in your abdomen. A small device with a light (laparoscope) will be inserted into one of your incisions. Surgical instruments will be inserted through the other incisions in order to perform the procedure. Your uterus and cervix may be removed through your vagina or cut into small pieces and removed through the small incisions. Any other organs that need to be removed will also be removed this way. The gas will be released from inside your abdomen. Your incisions will be closed with stitches (sutures), skin glue, or adhesive strips. A bandage (dressing) may be placed over your incisions. The procedure may vary among health care providers and hospitals. What happens after the procedure? Your blood pressure, heart rate, breathing rate, and blood oxygen level will be monitored until you leave the hospital or clinic. You will be given medicine for pain as needed. You will be encouraged to walk as soon as possible. You will also use a device to help you breathe or do breathing exercises to keep your lungs clear. You may have to wear compression stockings. These stockings help to prevent blood clots and reduce swelling in your legs. You will need to wear a sanitary pad for vaginal discharge or bleeding. Summary Total laparoscopic hysterectomy is a procedure to remove your uterus, cervix, and sometimes the fallopian tubes and ovaries. This procedure can  affect the way you feel about yourself. Talk with your health care provider about the physical and emotional changes hysterectomy may cause. After this procedure, you will no longer be able to have a baby, and you will no longer have a menstrual period. You will be given pain medicine to control discomfort after this procedure. Plan to have a responsible adult take you home from the hospital or clinic. This information is not intended to replace advice given to you by your health care provider. Make sure you discuss any questions you have with your health care provider. Document Revised: 08/04/2020 Document Reviewed: 08/04/2020 Elsevier Patient Education  [place].

## 2021-10-08 NOTE — Progress Notes (Signed)
Gynecology Pelvic Pain Evaluation   Chief Complaint: Pelvic pain  History of Present Illness:   Patient is a 40 y.o. G0 WF who LMP was Patient's last menstrual period was 10/08/2021., presents today for a problem visit.  She complains of pain.   Her pain is localized to the RLQ area, described as constant, stabbing, and aching, began several years ago and its severity is described as severe. The pain radiates to the  Non-radiating. She has these associated symptoms which include pain w MBS; also irreg menses that are more and more heavy. Patient has these modifiers which include nothing that make it better and unable to associate with any factor that make it worse.  Context includes: dx by laparoscopy in 2007 w endometriosis, never has had treatment.   Desires no pregnancy.  Has wife and she also does not desire children in the relationship.  PMHx: She  has a past medical history of Anxiety, Brain tumor (benign) ([place]) (2011), Chronic pain due to injury, and Endometriosis. Also,  has a past surgical history that includes Dilation and curettage of uterus (2007); Tonsillectomy (2008); Brain surgery (2011); Shoulder surgery (Left, 2011); Spine surgery (2013); and neck fusion., family history includes Breast cancer in her cousin; Breast cancer (age of onset: 57) in her mother; Cancer in her father, mother, paternal grandfather, and paternal grandmother; Colon cancer in her paternal grandmother; Diabetes in her mother and paternal grandmother; Heart attack in her father; Lung cancer in her paternal grandfather; Skin cancer in her father; Stroke in her paternal grandmother.,  reports that she quit smoking about 8 years ago. Her smoking use included cigarettes. She has never used smokeless tobacco. She reports that she does not drink alcohol and does not use drugs.  She currently has no medications in their medication list. Also, is allergic to aspirin and penicillins.  Review of Systems  Constitutional:   Negative for chills, fever and malaise/fatigue.  HENT:  Negative for congestion, sinus pain and sore throat.   Eyes:  Negative for blurred vision and pain.  Respiratory:  Negative for cough and wheezing.   Cardiovascular:  Negative for chest pain and leg swelling.  Gastrointestinal:  Positive for abdominal pain and nausea. Negative for constipation, diarrhea, heartburn and vomiting.  Genitourinary:  Negative for dysuria, frequency, hematuria and urgency.  Musculoskeletal:  Positive for joint pain. Negative for back pain, myalgias and neck pain.  Skin:  Negative for itching and rash.  Neurological:  Positive for weakness. Negative for dizziness and tremors.  Endo/Heme/Allergies:  Does not bruise/bleed easily.  Psychiatric/Behavioral:  Negative for depression. The patient is nervous/anxious. The patient does not have insomnia.    Objective: BP 100/60   Ht 5\' 5"  (1.651 m)   Wt 152 lb (68.9 kg)   LMP 10/08/2021   BMI 25.29 kg/m  Physical Exam Constitutional:      General: She is not in acute distress.    Appearance: She is well-developed.  Genitourinary:     Right Labia: No rash or tenderness.    Left Labia: No tenderness or rash.    No vaginal erythema or bleeding.      Right Adnexa: tender and full.    Right Adnexa: no mass present.    Left Adnexa: not tender and no mass present.    No cervical motion tenderness, discharge, polyp or nabothian cyst.     Uterus is not enlarged.     No uterine mass detected.    Pelvic exam was performed with  patient in the lithotomy position.  HENT:     Head: Normocephalic and atraumatic.     Nose: Nose normal.  Abdominal:     General: There is no distension.     Palpations: Abdomen is soft.     Tenderness: There is no abdominal tenderness.  Musculoskeletal:        General: Normal range of motion.  Neurological:     Mental Status: She is alert and oriented to person, place, and time.     Cranial Nerves: No cranial nerve deficit.  Skin:     General: Skin is warm and dry.  Psychiatric:        Attention and Perception: Attention normal.        Mood and Affect: Mood and affect normal.        Speech: Speech normal.        Behavior: Behavior normal.        Thought Content: Thought content normal.        Judgment: Judgment normal.    Female chaperone present for pelvic portion of the physical exam  Assessment: 40 y.o.  1. Pelvic pain - US PELVIS TRANSVAGINAL NON-OB (TV ONLY); Future  2. Endometriosis - US PELVIS TRANSVAGINAL NON-OB (TV ONLY); Future  3. Irregular menses - US PELVIS TRANSVAGINAL NON-OB (TV ONLY); Future  4. Screening for cervical cancer - Cytology - PAP  5. Chronic constipation   DIscussed tx options for chronic pain related to endometriosis    Pt desires hysterectomy    Assess w [place] and sx's- may benefit from complete (BSO) due to severity of sx's and colon involvement w sx's.  Assess for endometrioma.  Discussed pros and cons of ovarian preservation vs removal DIscussed Lupron, laparoscopy w resection, OCPs.  Barnett Applebaum, MD, [facility], [facility] 10/08/2021  4:27 PM

## 2021-10-10 LAB — CYTOLOGY - PAP
Comment: NEGATIVE
Diagnosis: NEGATIVE
High risk HPV: NEGATIVE

## 2021-10-19 ENCOUNTER — Other Ambulatory Visit: Payer: Self-pay | Admitting: Obstetrics & Gynecology

## 2021-10-19 ENCOUNTER — Ambulatory Visit (INDEPENDENT_AMBULATORY_CARE_PROVIDER_SITE_OTHER): Payer: BLUE CROSS/BLUE SHIELD

## 2021-10-19 ENCOUNTER — Other Ambulatory Visit: Payer: Self-pay

## 2021-10-19 DIAGNOSIS — N809 Endometriosis, unspecified: Secondary | ICD-10-CM

## 2021-10-19 DIAGNOSIS — N926 Irregular menstruation, unspecified: Secondary | ICD-10-CM

## 2021-10-19 DIAGNOSIS — R102 Pelvic and perineal pain: Secondary | ICD-10-CM

## 2021-10-23 ENCOUNTER — Ambulatory Visit: Payer: BLUE CROSS/BLUE SHIELD | Admitting: Obstetrics & Gynecology

## 2021-11-07 ENCOUNTER — Ambulatory Visit: Payer: BLUE CROSS/BLUE SHIELD | Admitting: Obstetrics & Gynecology

## 2021-11-12 ENCOUNTER — Other Ambulatory Visit: Payer: Self-pay

## 2021-11-12 ENCOUNTER — Encounter: Payer: Self-pay | Admitting: Obstetrics & Gynecology

## 2021-11-12 ENCOUNTER — Ambulatory Visit (INDEPENDENT_AMBULATORY_CARE_PROVIDER_SITE_OTHER): Payer: BLUE CROSS/BLUE SHIELD | Admitting: Obstetrics & Gynecology

## 2021-11-12 VITALS — BP 110/70 | Ht 65.0 in | Wt 149.0 lb

## 2021-11-12 DIAGNOSIS — N83202 Unspecified ovarian cyst, left side: Secondary | ICD-10-CM

## 2021-11-12 DIAGNOSIS — N809 Endometriosis, unspecified: Secondary | ICD-10-CM | POA: Diagnosis not present

## 2021-11-12 DIAGNOSIS — R102 Pelvic and perineal pain: Secondary | ICD-10-CM

## 2021-11-12 NOTE — Patient Instructions (Signed)
Total Laparoscopic Hysterectomy A total laparoscopic hysterectomy is a minimally invasive surgery to remove the uterus and cervix. The fallopian tubes and ovaries can also be removed during this surgery, if necessary. This procedure may be done to treat problems such as: Growths in the uterus (uterine fibroids) that are not cancer but cause symptoms. A condition that causes the lining of the uterus to grow in other areas (endometriosis). Problems with pelvic support. Cancer of the cervix, ovaries, uterus, or tissue that lines the uterus (endometrium). Excessive bleeding in the uterus. After this procedure, you will no longer be able to have a baby, and you will no longer have a menstrual period. Tell a health care provider about: Any allergies you have. All medicines you are taking, including vitamins, herbs, eye drops, creams, and over-the-counter medicines. Any problems you or family members have had with anesthetic medicines. Any blood disorders you have. Any surgeries you have had. Any medical conditions you have. Whether you are pregnant or may be pregnant. What are the risks? Generally, this is a safe procedure. However, problems may occur, including: Infection. Bleeding. Blood clots in the legs or lungs. Allergic reactions to medicines. Damage to nearby structures or organs. Having to change from this surgery to one in which a large incision is made in the abdomen (abdominal hysterectomy). What happens before the procedure? Staying hydrated Follow instructions from your health care provider about hydration, which may include: Up to 2 hours before the procedure - you may continue to drink clear liquids, such as water, clear fruit juice, black coffee, and plain tea.  Eating and drinking restrictions Follow instructions from your health care provider about eating and drinking, which may include: 8 hours before the procedure - stop eating heavy meals or foods, such as meat, fried  foods, or fatty foods. 6 hours before the procedure - stop eating light meals or foods, such as toast or cereal. 6 hours before the procedure - stop drinking milk or drinks that contain milk. 2 hours before the procedure - stop drinking clear liquids. Medicines Ask your health care provider about: Changing or stopping your regular medicines. This is especially important if you are taking diabetes medicines or blood thinners. Taking medicines such as aspirin and ibuprofen. These medicines can thin your blood. Do not take these medicines unless your health care provider tells you to take them. Taking over-the-counter medicines, vitamins, herbs, and supplements. You may be asked to take medicine that helps you have a bowel movement (laxative) to prevent constipation. General instructions If you were asked to do bowel preparation before the procedure, follow instructions from your health care provider. This procedure can affect the way you feel about yourself. Talk with your health care provider about the physical and emotional changes hysterectomy may cause. Do not use any products that contain nicotine or tobacco for at least 4 weeks before the procedure. These products include cigarettes, chewing tobacco, and vaping devices, such as e-cigarettes. If you need help quitting, ask your health care provider. Plan to have a responsible adult take you home from the hospital or clinic. Plan to have a responsible adult care for you for the time you are told after you leave the hospital or clinic. This is important. Surgery safety Ask your health care provider: How your surgery site will be marked. What steps will be taken to help prevent infection. These may include: Removing hair at the surgery site. Washing skin with a germ-killing soap. Receiving antibiotic medicine. What happens during the  procedure? An IV will be inserted into one of your veins. You will be given one or more of the following: A  medicine to help you relax (sedative). A medicine to make you fall asleep (general anesthetic). A medicine to numb the area (local anesthetic). A medicine that is injected into your spine to numb the area below and slightly above the injection site (spinal anesthetic). A medicine that is injected into an area of your body to numb everything below the injection site (regional anesthetic). A gas will be used to inflate your abdomen. This will allow your surgeon to look inside your abdomen and do the surgery. Three or four small incisions will be made in your abdomen. A small device with a light (laparoscope) will be inserted into one of your incisions. Surgical instruments will be inserted through the other incisions in order to perform the procedure. Your uterus and cervix may be removed through your vagina or cut into small pieces and removed through the small incisions. Any other organs that need to be removed will also be removed this way. The gas will be released from inside your abdomen. Your incisions will be closed with stitches (sutures), skin glue, or adhesive strips. A bandage (dressing) may be placed over your incisions. The procedure may vary among health care providers and hospitals. What happens after the procedure? Your blood pressure, heart rate, breathing rate, and blood oxygen level will be monitored until you leave the hospital or clinic. You will be given medicine for pain as needed. You will be encouraged to walk as soon as possible. You will also use a device to help you breathe or do breathing exercises to keep your lungs clear. You may have to wear compression stockings. These stockings help to prevent blood clots and reduce swelling in your legs. You will need to wear a sanitary pad for vaginal discharge or bleeding. Summary Total laparoscopic hysterectomy is a procedure to remove your uterus, cervix, and sometimes the fallopian tubes and ovaries. This procedure can  affect the way you feel about yourself. Talk with your health care provider about the physical and emotional changes hysterectomy may cause. After this procedure, you will no longer be able to have a baby, and you will no longer have a menstrual period. You will be given pain medicine to control discomfort after this procedure. Plan to have a responsible adult take you home from the hospital or clinic. This information is not intended to replace advice given to you by your health care provider. Make sure you discuss any questions you have with your health care provider. Document Revised: 08/04/2020 Document Reviewed: 08/04/2020 Elsevier Patient Education  [place].

## 2021-11-12 NOTE — Progress Notes (Signed)
  HPI: Pt continues with lower pelvic pains.   She has known endometriosis (prior lap) and long term pain, ready for resolution.  Prefers surgery to Lupron, OCPs.  No desire for pregnancy.  Ultrasound demonstrates normal findings with small left sided thickened follicle or cyst  PMHx: She  has a past medical history of Anxiety, Brain tumor (benign) ([place]) (2011), Chronic pain due to injury, and Endometriosis. Also,  has a past surgical history that includes Dilation and curettage of uterus (2007); Tonsillectomy (2008); Brain surgery (2011); Shoulder surgery (Left, 2011); Spine surgery (2013); and neck fusion., family history includes Breast cancer in her cousin; Breast cancer (age of onset: 56) in her mother; Cancer in her father, mother, paternal grandfather, and paternal grandmother; Colon cancer in her paternal grandmother; Diabetes in her mother and paternal grandmother; Heart attack in her father; Lung cancer in her paternal grandfather; Skin cancer in her father; Stroke in her paternal grandmother.,  reports that she quit smoking about 8 years ago. Her smoking use included cigarettes. She has never used smokeless tobacco. She reports that she does not drink alcohol and does not use drugs.  She currently has no medications in their medication list. Also, is allergic to aspirin and penicillins.  Review of Systems  All other systems reviewed and are negative.  Objective: BP 110/70   Ht 5\' 5"  (1.651 m)   Wt 149 lb (67.6 kg)   BMI 24.79 kg/m   Physical examination Constitutional NAD, Conversant  Skin No rashes, lesions or ulceration.   Extremities: Moves all appropriately.  Normal ROM for age. No lymphadenopathy.  Neuro: Grossly intact  Psych: Oriented to PPT.  Normal mood. Normal affect.   ULTRASOUND REPORT   Location: [facility] Date of Service: 10/19/2021      Indications:Pelvic Pain Findings:  The uterus is anteverted and measures 8.4 x 3.8 x 5.1 cm. Echo texture is  heterogenous without evidence of focal masses. The Endometrium measures 6.9 mm.   Right Ovary measures 2.1 x 2.3 x 1.4 cm. It is normal in appearance. Left Ovary measures 2.2 x 2.0 x 2.5 cm.  There is a mildly complex follicle/cyst with a more echogenic periphery than usually seen with follicles of this size.  This measures only 86mm, but with patient's history of  endometriosis, could be of relevance.   Survey of the adnexa demonstrates no adnexal masses. There is a small amount of free fluid in the cul de sac.   Impression: 1.  Normal pelvic ultrasound.   Recommendations: 1.Clinical correlation with the patient's History and Physical Exam.   Assessment:  Pelvic pain  Endometriosis  Left ovarian cyst  Discussed options, plan for TLH, LSO    Pros and cons discussed.  Counseled as to removal vs preservation of right ovary (endometriosis, menopause consequences).  Desires preservation and avoidance of menopause sx's if possible.  A total of 21 minutes were spent face-to-face with the patient as well as preparation, review, communication, and documentation during this encounter.      Barnett Applebaum, MD, [facility], [facility] 11/12/2021  3:16 PM

## 2021-11-14 ENCOUNTER — Other Ambulatory Visit: Payer: Self-pay | Admitting: Obstetrics & Gynecology

## 2021-11-15 ENCOUNTER — Telehealth: Payer: Self-pay

## 2021-11-15 NOTE — Telephone Encounter (Signed)
Called patient to schedule TLH/LSO, Cystoscopy w Kenton Kingfisher  DOS 12/18/21  H&P 12/23 @  11:20  Pre-admit phone call appointment to be requested - date and time will be included on H&P paper work. Also all appointments will be updated on pt MyChart. Explained that this appointment has a call window. Based on the time scheduled will indicate if the call will be received within a 4 hour window before 1:00 or after.  Advised that pt may also receive calls from the hospital pharmacy and pre-service center.  Confirmed pt has BCBS as Chartered certified accountant. No secondary insurance.   *Will confirm with [facility] bilateral salpingectomy, LEFT Oophorectomy.

## 2021-11-15 NOTE — Telephone Encounter (Signed)
Both tubes, left ovary

## 2021-11-15 NOTE — Telephone Encounter (Signed)
-----   Message from Gae Dry, MD sent at 11/12/2021  3:19 PM EST ----- Regarding: Surgery Surgery Booking Request Patient Full Name:  Elizabeth Peters  MRN: [id number]  DOB: 1981/04/10  Surgeon: Hoyt Koch, MD  Requested Surgery Date and Time: DEC 27 Primary Diagnosis AND Code:     1. Pelvic pain  R10.2  2. Endometriosis  N80.9  3. Left ovarian cyst  N83.202  Secondary Diagnosis and Code:  Surgical Procedure: TLH/LSO, Cystoscopy RNFA Requested?: No L&D Notification: No Admission Status: same day surgery Length of Surgery: 75 min Special Case Needs: No H&P: Yes Phone Interview???:  Yes Interpreter: No Medical Clearance:  No Special Scheduling Instructions: No Any known health/anesthesia issues, diabetes, sleep apnea, latex allergy, defibrillator/pacemaker?: No Acuity: P3   (P1 highest, P2 delay may cause harm, P3 low, elective gyn, P4 lowest) Post op follow up visits: yes, 2 weeks

## 2021-12-02 DIAGNOSIS — Z981 Arthrodesis status: Secondary | ICD-10-CM | POA: Diagnosis not present

## 2021-12-02 DIAGNOSIS — M47814 Spondylosis without myelopathy or radiculopathy, thoracic region: Secondary | ICD-10-CM | POA: Diagnosis not present

## 2021-12-02 DIAGNOSIS — G8929 Other chronic pain: Secondary | ICD-10-CM | POA: Diagnosis not present

## 2021-12-02 DIAGNOSIS — M545 Low back pain, unspecified: Secondary | ICD-10-CM | POA: Diagnosis not present

## 2021-12-02 DIAGNOSIS — M546 Pain in thoracic spine: Secondary | ICD-10-CM | POA: Diagnosis not present

## 2021-12-02 DIAGNOSIS — M542 Cervicalgia: Secondary | ICD-10-CM | POA: Diagnosis not present

## 2021-12-02 DIAGNOSIS — M47816 Spondylosis without myelopathy or radiculopathy, lumbar region: Secondary | ICD-10-CM | POA: Diagnosis not present

## 2021-12-02 DIAGNOSIS — M4802 Spinal stenosis, cervical region: Secondary | ICD-10-CM | POA: Diagnosis not present

## 2021-12-07 ENCOUNTER — Encounter: Payer: Self-pay | Admitting: Obstetrics & Gynecology

## 2021-12-07 ENCOUNTER — Ambulatory Visit (INDEPENDENT_AMBULATORY_CARE_PROVIDER_SITE_OTHER): Payer: BLUE CROSS/BLUE SHIELD | Admitting: Obstetrics & Gynecology

## 2021-12-07 ENCOUNTER — Other Ambulatory Visit: Payer: Self-pay

## 2021-12-07 VITALS — BP 118/70 | Ht 65.0 in | Wt 146.0 lb

## 2021-12-07 DIAGNOSIS — R102 Pelvic and perineal pain: Secondary | ICD-10-CM

## 2021-12-07 DIAGNOSIS — N809 Endometriosis, unspecified: Secondary | ICD-10-CM

## 2021-12-07 DIAGNOSIS — N83202 Unspecified ovarian cyst, left side: Secondary | ICD-10-CM

## 2021-12-07 NOTE — Progress Notes (Signed)
PRE-OPERATIVE HISTORY AND PHYSICAL EXAM  HPI:  Elizabeth Peters is a 40 y.o. No obstetric history on file. Patient's last menstrual period was 12/04/2021 (exact date).; she is being admitted for surgery related to pelvic pain.  Pt continues with lower pelvic pains.   She has known endometriosis (prior lap) and long term pain, ready for resolution.  Prefers surgery to Lupron, OCPs.  No desire for pregnancy.ALso has had cyst on left ovary on occasion.  PMHx: Past Medical History:  Diagnosis Date   Anxiety    Brain tumor (benign) ([place]) 2011   Chronic pain due to injury    Endometriosis    Past Surgical History:  Procedure Laterality Date   BRAIN SURGERY  2011   tumor   [place] OF UTERUS  2007   neck fusion     SHOULDER SURGERY Left 2011   [place]  2013   neck fulsion   TONSILLECTOMY  2008   Family History  Problem Relation Age of Onset   Diabetes Mother    Cancer Mother    Breast cancer Mother 94   Cancer Father    Skin cancer Father    Heart attack Father    Stroke Paternal Grandmother    Diabetes Paternal Grandmother    Cancer Paternal Grandmother    Colon cancer Paternal Grandmother    Cancer Paternal Grandfather    Lung cancer Paternal Grandfather    Breast cancer Cousin        maternal side early 58's   Social History   Tobacco Use   Smoking status: Former    Years: 4.00    Types: Cigarettes    Quit date: 12/16/2012    Years since quitting: 8.9   Smokeless tobacco: Never  Substance Use Topics   Alcohol use: No    Alcohol/week: 0.0 standard drinks   Drug use: No   No current outpatient medications on file. Allergies: Aspirin and Penicillins  Review of Systems  Constitutional:  Negative for chills, fever and malaise/fatigue.  HENT:  Negative for congestion, sinus pain and sore throat.   Eyes:  Negative for blurred vision and pain.  Respiratory:  Negative for cough and wheezing.   Cardiovascular:  Negative for chest pain and leg  swelling.  Gastrointestinal:  Negative for abdominal pain, constipation, diarrhea, heartburn, nausea and vomiting.  Genitourinary:  Negative for dysuria, frequency, hematuria and urgency.  Musculoskeletal:  Negative for back pain, joint pain, myalgias and neck pain.  Skin:  Negative for itching and rash.  Neurological:  Negative for dizziness, tremors and weakness.  Endo/Heme/Allergies:  Does not bruise/bleed easily.  Psychiatric/Behavioral:  Negative for depression. The patient is not nervous/anxious and does not have insomnia.    Objective: BP 118/70    Ht 5\' 5"  (1.651 m)    Wt 146 lb (66.2 kg)    LMP 12/04/2021 (Exact Date)    BMI 24.30 kg/m   Filed Weights   12/07/21 1129  Weight: 146 lb (66.2 kg)   Physical Exam Constitutional:      General: She is not in acute distress.    Appearance: She is well-developed.  HENT:     Head: Normocephalic and atraumatic. No laceration.     Right Ear: Hearing normal.     Left Ear: Hearing normal.     Mouth/Throat:     Pharynx: Uvula midline.  Eyes:     Pupils: Pupils are equal, round, and reactive to light.  Neck:  Thyroid: No thyromegaly.  Cardiovascular:     Rate and Rhythm: Normal rate and regular rhythm.     Heart sounds: No murmur heard.   No friction rub. No gallop.  Pulmonary:     Effort: Pulmonary effort is normal. No respiratory distress.     Breath sounds: Normal breath sounds. No wheezing.  Abdominal:     General: Bowel sounds are normal. There is no distension.     Palpations: Abdomen is soft.     Tenderness: There is no abdominal tenderness. There is no rebound.  Musculoskeletal:        General: Normal range of motion.     Cervical back: Normal range of motion and neck supple.  Neurological:     Mental Status: She is alert and oriented to person, place, and time.     Cranial Nerves: No cranial nerve deficit.  Skin:    General: Skin is warm and dry.  Psychiatric:        Judgment: Judgment normal.  Vitals reviewed.     Assessment: 1. Endometriosis   2. Pelvic pain   3. Left ovarian cyst   Plan TLH, BS, also left oophorectomy due to endometriosis and recurrent ovarian cyst Retain right ovary to avoid menopause Counseled on risk of continued endometriosis w this ovary, but chance of sx's and pain low and benefits of not becoming menopausal just yet outweighs that risk.  I have had a careful discussion with this patient about all the options available and the risk/benefits of each. I have fully informed this patient that surgery may subject her to a variety of discomforts and risks: She understands that most patients have surgery with little difficulty, but problems can happen ranging from minor to fatal. These include nausea, vomiting, pain, bleeding, infection, poor healing, hernia, or formation of adhesions. Unexpected reactions may occur from any drug or anesthetic given. Unintended injury may occur to other pelvic or abdominal structures such as Fallopian tubes, ovaries, bladder, ureter (tube from kidney to bladder), or bowel. Nerves going from the pelvis to the legs may be injured. Any such injury may require immediate or later additional surgery to correct the problem. Excessive blood loss requiring transfusion is very unlikely but possible. Dangerous blood clots may form in the legs or lungs. Physical and sexual activity will be restricted in varying degrees for an indeterminate period of time but most often 2-6 weeks.  Finally, she understands that it is impossible to list every possible undesirable effect and that the condition for which surgery is done is not always cured or significantly improved, and in rare cases may be even worse.Ample time was given to answer all questions.  Barnett Applebaum, MD, [facility], [facility] 12/07/2021  11:46 AM

## 2021-12-07 NOTE — H&P (View-Only) (Signed)
PRE-OPERATIVE HISTORY AND PHYSICAL EXAM  HPI:  Elizabeth Peters is a 40 y.o. No obstetric history on file. Patient's last menstrual period was 12/04/2021 (exact date).; she is being admitted for surgery related to pelvic pain.  Pt continues with lower pelvic pains.   She has known endometriosis (prior lap) and long term pain, ready for resolution.  Prefers surgery to Lupron, OCPs.  No desire for pregnancy.ALso has had cyst on left ovary on occasion.  PMHx: Past Medical History:  Diagnosis Date   Anxiety    Brain tumor (benign) ([place]) 2011   Chronic pain due to injury    Endometriosis    Past Surgical History:  Procedure Laterality Date   BRAIN SURGERY  2011   tumor   [place] OF UTERUS  2007   neck fusion     SHOULDER SURGERY Left 2011   [place]  2013   neck fulsion   TONSILLECTOMY  2008   Family History  Problem Relation Age of Onset   Diabetes Mother    Cancer Mother    Breast cancer Mother 47   Cancer Father    Skin cancer Father    Heart attack Father    Stroke Paternal Grandmother    Diabetes Paternal Grandmother    Cancer Paternal Grandmother    Colon cancer Paternal Grandmother    Cancer Paternal Grandfather    Lung cancer Paternal Grandfather    Breast cancer Cousin        maternal side early 24's   Social History   Tobacco Use   Smoking status: Former    Years: 4.00    Types: Cigarettes    Quit date: 12/16/2012    Years since quitting: 8.9   Smokeless tobacco: Never  Substance Use Topics   Alcohol use: No    Alcohol/week: 0.0 standard drinks   Drug use: No   No current outpatient medications on file. Allergies: Aspirin and Penicillins  Review of Systems  Constitutional:  Negative for chills, fever and malaise/fatigue.  HENT:  Negative for congestion, sinus pain and sore throat.   Eyes:  Negative for blurred vision and pain.  Respiratory:  Negative for cough and wheezing.   Cardiovascular:  Negative for chest pain and leg  swelling.  Gastrointestinal:  Negative for abdominal pain, constipation, diarrhea, heartburn, nausea and vomiting.  Genitourinary:  Negative for dysuria, frequency, hematuria and urgency.  Musculoskeletal:  Negative for back pain, joint pain, myalgias and neck pain.  Skin:  Negative for itching and rash.  Neurological:  Negative for dizziness, tremors and weakness.  Endo/Heme/Allergies:  Does not bruise/bleed easily.  Psychiatric/Behavioral:  Negative for depression. The patient is not nervous/anxious and does not have insomnia.    Objective: BP 118/70    Ht 5\' 5"  (1.651 m)    Wt 146 lb (66.2 kg)    LMP 12/04/2021 (Exact Date)    BMI 24.30 kg/m   Filed Weights   12/07/21 1129  Weight: 146 lb (66.2 kg)   Physical Exam Constitutional:      General: She is not in acute distress.    Appearance: She is well-developed.  HENT:     Head: Normocephalic and atraumatic. No laceration.     Right Ear: Hearing normal.     Left Ear: Hearing normal.     Mouth/Throat:     Pharynx: Uvula midline.  Eyes:     Pupils: Pupils are equal, round, and reactive to light.  Neck:  Thyroid: No thyromegaly.  Cardiovascular:     Rate and Rhythm: Normal rate and regular rhythm.     Heart sounds: No murmur heard.   No friction rub. No gallop.  Pulmonary:     Effort: Pulmonary effort is normal. No respiratory distress.     Breath sounds: Normal breath sounds. No wheezing.  Abdominal:     General: Bowel sounds are normal. There is no distension.     Palpations: Abdomen is soft.     Tenderness: There is no abdominal tenderness. There is no rebound.  Musculoskeletal:        General: Normal range of motion.     Cervical back: Normal range of motion and neck supple.  Neurological:     Mental Status: She is alert and oriented to person, place, and time.     Cranial Nerves: No cranial nerve deficit.  Skin:    General: Skin is warm and dry.  Psychiatric:        Judgment: Judgment normal.  Vitals reviewed.     Assessment: 1. Endometriosis   2. Pelvic pain   3. Left ovarian cyst   Plan TLH, BS, also left oophorectomy due to endometriosis and recurrent ovarian cyst Retain right ovary to avoid menopause Counseled on risk of continued endometriosis w this ovary, but chance of sx's and pain low and benefits of not becoming menopausal just yet outweighs that risk.  I have had a careful discussion with this patient about all the options available and the risk/benefits of each. I have fully informed this patient that surgery may subject her to a variety of discomforts and risks: She understands that most patients have surgery with little difficulty, but problems can happen ranging from minor to fatal. These include nausea, vomiting, pain, bleeding, infection, poor healing, hernia, or formation of adhesions. Unexpected reactions may occur from any drug or anesthetic given. Unintended injury may occur to other pelvic or abdominal structures such as Fallopian tubes, ovaries, bladder, ureter (tube from kidney to bladder), or bowel. Nerves going from the pelvis to the legs may be injured. Any such injury may require immediate or later additional surgery to correct the problem. Excessive blood loss requiring transfusion is very unlikely but possible. Dangerous blood clots may form in the legs or lungs. Physical and sexual activity will be restricted in varying degrees for an indeterminate period of time but most often 2-6 weeks.  Finally, she understands that it is impossible to list every possible undesirable effect and that the condition for which surgery is done is not always cured or significantly improved, and in rare cases may be even worse.Ample time was given to answer all questions.  Barnett Applebaum, MD, [facility], [facility] 12/07/2021  11:46 AM

## 2021-12-07 NOTE — Patient Instructions (Signed)
Total Laparoscopic Hysterectomy, Care After The following information offers guidance on how to care for yourself after your procedure. Your health care provider may also give you more specific instructions. If you have problems or questions, contact your health care provider. What can I expect after the procedure? After the procedure, it is common to have: Pain, bruising, and numbness around your incisions. Tiredness (fatigue). Poor appetite. Less interest in sex. Vaginal discharge or bleeding. You will need to use a sanitary pad after this procedure. Feelings of sadness or other emotions. If your ovaries were also removed, it is also common to have symptoms of menopause, such as hot flashes, night sweats, and lack of sleep (insomnia). Follow these instructions at home: Medicines Take over-the-counter and prescription medicines only as told by your health care provider. Ask your health care provider if the medicine prescribed to you: Requires you to avoid driving or using machinery. Can cause constipation. You may need to take these actions to prevent or treat constipation: Drink enough fluid to keep your urine pale yellow. Take over-the-counter or prescription medicines. Eat foods that are high in fiber, such as beans, whole grains, and fresh fruits and vegetables. Limit foods that are high in fat and processed sugars, such as fried or sweet foods. Incision care  Follow instructions from your health care provider about how to take care of your incisions. Make sure you: Wash your hands with soap and water for at least 20 seconds before and after you change your bandage (dressing). If soap and water are not available, use hand sanitizer. Change your dressing as told by your health care provider. Leave stitches (sutures), skin glue, or adhesive strips in place. These skin closures may need to stay in place for 2 weeks or longer. If adhesive strip edges start to loosen and curl up, you may  trim the loose edges. Do not remove adhesive strips completely unless your health care provider tells you to do that. Check your incision areas every day for signs of infection. Check for: More redness, swelling, or pain. Fluid or blood. Warmth. Pus or a bad smell. Activity  Rest as told by your health care provider. Avoid sitting for a long time without moving. Get up to take short walks every 1-2 hours. This is important to improve blood flow and breathing. Ask for help if you feel weak or unsteady. Return to your normal activities as told by your health care provider. Ask your health care provider what activities are safe for you. Do not lift anything that is heavier than 10 lb (4.5 kg), or the limit that you are told, for one month after surgery or until your health care provider says that it is safe. If you were given a sedative during the procedure, it can affect you for several hours. Do not drive or operate machinery until your health care provider says that it is safe. Lifestyle Do not use any products that contain nicotine or tobacco. These products include cigarettes, chewing tobacco, and vaping devices, such as e-cigarettes. These can delay healing after surgery. If you need help quitting, ask your health care provider. Do not drink alcohol until your health care provider approves. General instructions  Do not douche, use tampons, or have sex for at least 6 weeks, or as told by your health care provider. If you struggle with physical or emotional changes after your procedure, speak with your health care provider or a therapist. Do not take baths, swim, or use a hot tub  until your health care provider approves. You may only be allowed to take showers for 2-3 weeks. Keep your dressing dry until your health care provider says it can be removed. Try to have someone at home with you for the first 1-2 weeks to help with your daily chores. Wear compression stockings as told by your health  care provider. These stockings help to prevent blood clots and reduce swelling in your legs. Keep all follow-up visits. This is important. Contact a health care provider if: You have any of these signs of infection: Chills or a fever. More redness, swelling, or pain around an incision. Fluid or blood coming from an incision. Warmth coming from an incision. Pus or a bad smell coming from an incision. An incision opens. You feel dizzy or light-headed. You have pain or bleeding when you urinate, or you are unable to urinate. You have abnormal vaginal discharge. You have pain that does not get better with medicine. Get help right away if: You have a fever and your symptoms suddenly get worse. You have severe abdominal pain. You have chest pain or shortness of breath. You faint. You have pain, swelling, or redness in your leg. You have heavy vaginal bleeding with blood clots, soaking through a sanitary pad in less than 1 hour. These symptoms may represent a serious problem that is an emergency. Do not wait to see if the symptoms will go away. Get medical help right away. Call your local emergency services (911 in the [place] Do not drive yourself to the hospital. Summary After the procedure, it is common to have pain and bruising around your incisions. Do not take baths, swim, or use a hot tub until your health care provider approves. Do not lift anything that is heavier than 10 lb (4.5 kg), or the limit that you are told, for one month after surgery or until your health care provider says that it is safe. Tell your health care provider if you have any signs or symptoms of infection after the procedure. Get help right away if you have severe abdominal pain, chest pain, shortness of breath, or heavy bleeding from your vagina. This information is not intended to replace advice given to you by your health care provider. Make sure you discuss any questions you have with your health care  provider. Document Revised: 08/04/2020 Document Reviewed: 08/04/2020 Elsevier Patient Education  [place].

## 2021-12-07 NOTE — Addendum Note (Signed)
Addended by: Gae Dry on: 12/07/2021 05:13 PM   Modules accepted: Orders

## 2021-12-11 ENCOUNTER — Encounter
Admission: RE | Admit: 2021-12-11 | Discharge: 2021-12-11 | Disposition: A | Discharge status: Home / Self Care | Payer: BLUE CROSS/BLUE SHIELD | Source: Ambulatory Visit | Attending: Obstetrics & Gynecology | Admitting: Obstetrics & Gynecology

## 2021-12-11 ENCOUNTER — Other Ambulatory Visit: Payer: Self-pay

## 2021-12-11 NOTE — Patient Instructions (Signed)
Your procedure is scheduled on: 12/18/21 Report to [facility]. To find out your arrival time please call [phone] between 1PM - 3PM on 12/14/21.  Remember: Instructions that are not followed completely may result in serious medical risk, up to and including death, or upon the discretion of your surgeon and anesthesiologist your surgery may need to be rescheduled.     _X__ 1. Do not eat food or drink any liquids after midnight the night before your procedure.                 No gum chewing or hard candies.   __X__2.  On the morning of surgery brush your teeth with toothpaste and water, you                 may rinse your mouth with mouthwash if you wish.  Do not swallow any              toothpaste of mouthwash.     _X__ 3.  No Alcohol for 24 hours before or after surgery.   _X__ 4.  Do Not Smoke or use e-cigarettes For 24 Hours Prior to Your Surgery.                 Do not use any chewable tobacco products for at least 6 hours prior to                 surgery.  ____  5.  Bring all medications with you on the day of surgery if instructed.   __X__  6.  Notify your doctor if there is any change in your medical condition      (cold, fever, infections).     Do not wear jewelry, make-up, hairpins, clips or nail polish. Do not wear lotions, powders, or perfumes.  Do not shave body hair body hair 48 hours prior to surgery. Men may shave face and neck. Do not bring valuables to the hospital.    [facility] is not responsible for any belongings or valuables.  Contacts, dentures/partials or body piercings may not be worn into surgery. Bring a case for your contacts, glasses or hearing aids, a denture cup will be supplied. Leave your suitcase in the car. After surgery it may be brought to your room. For patients admitted to the hospital, discharge time is determined by your treatment team.   Patients discharged the day of surgery will not  be allowed to drive home.   Please read over the following fact sheets that you were given:   CHG soap  __X__ Take these medicines the morning of surgery with A SIP OF WATER:    1. none  2.   3.   4.  5.  6.  ____ Fleet Enema (as directed)   __X__ Use CHG Soap/SAGE wipes as directed  ____ Use inhalers on the day of surgery  ____ Stop metformin/Janumet/Farxiga 2 days prior to surgery    ____ Take 1/2 of usual insulin dose the night before surgery. No insulin the morning          of surgery.   ____ Stop Blood Thinners Coumadin/Plavix/Xarelto/Pleta/Pradaxa/Eliquis/Effient/Aspirin  on   Or contact your Surgeon, Cardiologist or Medical Doctor regarding  ability to stop your blood thinners  __X__ Stop Anti-inflammatories 7 days before surgery such as Advil, Ibuprofen, Motrin,  BC or Goodies Powder, Naprosyn, Naproxen, Aleve, Aspirin    __X__ Stop all herbals or supplements,  fish oil or vitamins  until after surgery.    ____ Bring C-Pap to the hospital.    How to Use an Incentive Spirometer An incentive spirometer is a tool that measures how well you are filling your lungs with each breath. Learning to take long, deep breaths using this tool can help you keep your lungs clear and active. This may help to reverse or lessen your chance of developing breathing (pulmonary) problems, especially infection. You may be asked to use a spirometer: After a surgery. If you have a lung problem or a history of smoking. After a long period of time when you have been unable to move or be active. If the spirometer includes an indicator to show the highest number that you have reached, your health care provider or respiratory therapist will help you set a goal. Keep a log of your progress as told by your health care provider. What are the risks? Breathing too quickly may cause dizziness or cause you to pass out. Take your time so you do not get dizzy or light-headed. If you are in pain, you may need  to take pain medicine before doing incentive spirometry. It is harder to take a deep breath if you are having pain. How to use your incentive spirometer  Sit up on the edge of your bed or on a chair. Hold the incentive spirometer so that it is in an upright position. Before you use the spirometer, breathe out normally. Place the mouthpiece in your mouth. Make sure your lips are closed tightly around it. Breathe in slowly and as deeply as you can through your mouth, causing the piston or the ball to rise toward the top of the chamber. Hold your breath for 3-5 seconds, or for as long as possible. If the spirometer includes a coach indicator, use this to guide you in breathing. Slow down your breathing if the indicator goes above the marked areas. Remove the mouthpiece from your mouth and breathe out normally. The piston or ball will return to the bottom of the chamber. Rest for a few seconds, then repeat the steps 10 or more times. Take your time and take a few normal breaths between deep breaths so that you do not get dizzy or light-headed. Do this every 1-2 hours when you are awake. If the spirometer includes a goal marker to show the highest number you have reached (best effort), use this as a goal to work toward during each repetition. After each set of 10 deep breaths, cough a few times. This will help to make sure that your lungs are clear. If you have an incision on your chest or abdomen from surgery, place a pillow or a rolled-up towel firmly against the incision when you cough. This can help to reduce pain while taking deep breaths and coughing. General tips When you are able to get out of bed: Walk around often. Continue to take deep breaths and cough in order to clear your lungs. Keep using the incentive spirometer until your health care provider says it is okay to stop using it. If you have been in the hospital, you may be told to keep using the spirometer at home. Contact a health  care provider if: You are having difficulty using the spirometer. You have trouble using the spirometer as often as instructed. Your pain medicine is not giving enough relief for you to use the spirometer as told. You have a fever. Get help right away if: You develop shortness  of breath. You develop a cough with bloody mucus from the lungs. You have fluid or blood coming from an incision site after you cough. Summary An incentive spirometer is a tool that can help you learn to take long, deep breaths to keep your lungs clear and active. You may be asked to use a spirometer after a surgery, if you have a lung problem or a history of smoking, or if you have been inactive for a long period of time. Use your incentive spirometer as instructed every 1-2 hours while you are awake. If you have an incision on your chest or abdomen, place a pillow or a rolled-up towel firmly against your incision when you cough. This will help to reduce pain. Get help right away if you have shortness of breath, you cough up bloody mucus, or blood comes from your incision when you cough. This information is not intended to replace advice given to you by your health care provider. Make sure you discuss any questions you have with your health care provider. Document Revised: 02/21/2020 Document Reviewed: 02/21/2020 Elsevier Patient Education  [place].

## 2021-12-12 ENCOUNTER — Other Ambulatory Visit
Admission: RE | Admit: 2021-12-12 | Discharge: 2021-12-12 | Disposition: A | Discharge status: Home / Self Care | Payer: BLUE CROSS/BLUE SHIELD | Source: Ambulatory Visit | Attending: Obstetrics & Gynecology | Admitting: Obstetrics & Gynecology

## 2021-12-12 DIAGNOSIS — N809 Endometriosis, unspecified: Secondary | ICD-10-CM | POA: Insufficient documentation

## 2021-12-12 DIAGNOSIS — N83202 Unspecified ovarian cyst, left side: Secondary | ICD-10-CM | POA: Diagnosis not present

## 2021-12-12 DIAGNOSIS — R102 Pelvic and perineal pain: Secondary | ICD-10-CM | POA: Diagnosis not present

## 2021-12-12 DIAGNOSIS — Z01812 Encounter for preprocedural laboratory examination: Secondary | ICD-10-CM | POA: Insufficient documentation

## 2021-12-12 LAB — CBC
HCT: 36.9 % (ref 36.0–46.0)
Hemoglobin: 12.3 g/dL (ref 12.0–15.0)
MCH: 28.5 pg (ref 26.0–34.0)
MCHC: 33.3 g/dL (ref 30.0–36.0)
MCV: 85.4 fL (ref 80.0–100.0)
Platelets: 240 10*3/uL (ref 150–400)
RBC: 4.32 MIL/uL (ref 3.87–5.11)
RDW: 12.1 % (ref 11.5–15.5)
WBC: 4.4 10*3/uL (ref 4.0–10.5)
nRBC: 0 % (ref 0.0–0.2)

## 2021-12-12 LAB — TYPE AND SCREEN
ABO/RH(D): O POS
Antibody Screen: NEGATIVE

## 2021-12-18 ENCOUNTER — Ambulatory Visit
Admission: RE | Admit: 2021-12-18 | Payer: BLUE CROSS/BLUE SHIELD | Source: Ambulatory Visit | Admitting: Obstetrics & Gynecology

## 2021-12-18 DIAGNOSIS — M546 Pain in thoracic spine: Secondary | ICD-10-CM | POA: Diagnosis not present

## 2021-12-19 ENCOUNTER — Telehealth: Payer: Self-pay

## 2021-12-19 NOTE — Telephone Encounter (Signed)
Patient is calling wanting to reschedule surgury. Please advise ?

## 2021-12-20 NOTE — Telephone Encounter (Signed)
Pt calling to reschedule surgery; had to cancel d/t covid.  [phone]

## 2021-12-20 NOTE — Telephone Encounter (Signed)
Please advise rescheduling patient's surgery.

## 2021-12-20 NOTE — Telephone Encounter (Signed)
Attempted to reach the patient to reschedule surgery. Left message to return call.

## 2021-12-22 ENCOUNTER — Other Ambulatory Visit: Payer: Self-pay | Admitting: Obstetrics & Gynecology

## 2021-12-22 NOTE — Telephone Encounter (Signed)
Spoke with the patient. Surgery is rescheduled for 01/03/22, and 2 wk post op for 2/2 @ 2:55pm w/ Dr. Kenton Kingfisher.

## 2021-12-24 ENCOUNTER — Telehealth: Payer: Self-pay | Admitting: Obstetrics & Gynecology

## 2021-12-24 NOTE — Telephone Encounter (Signed)
Disregard

## 2022-01-01 ENCOUNTER — Encounter: Payer: BLUE CROSS/BLUE SHIELD | Admitting: Obstetrics & Gynecology

## 2022-01-02 ENCOUNTER — Other Ambulatory Visit: Payer: Self-pay | Admitting: Obstetrics & Gynecology

## 2022-01-02 ENCOUNTER — Encounter: Payer: BLUE CROSS/BLUE SHIELD | Admitting: Obstetrics & Gynecology

## 2022-01-02 DIAGNOSIS — R102 Pelvic and perineal pain: Secondary | ICD-10-CM

## 2022-01-02 MED ORDER — POVIDONE-IODINE 10 % EX SWAB: 2.0000 "application " | Freq: Once | CUTANEOUS | Status: DC

## 2022-01-02 MED ORDER — METRONIDAZOLE 500 MG/100ML IV SOLN
500.0000 mg | INTRAVENOUS | Status: AC
  Administered 2022-01-03: 500 mg via INTRAVENOUS
  Filled 2022-01-02: qty 100

## 2022-01-02 MED ORDER — GENTAMICIN SULFATE 40 MG/ML IJ SOLN
5.0000 mg/kg | INTRAVENOUS | Status: AC
  Administered 2022-01-03: 320 mg via INTRAVENOUS
  Filled 2022-01-02: qty 8

## 2022-01-02 MED ORDER — FAMOTIDINE 20 MG PO TABS: 20.0000 mg | ORAL_TABLET | Freq: Once | ORAL | Status: AC

## 2022-01-02 MED ORDER — LACTATED RINGERS IV SOLN: INTRAVENOUS | Status: DC

## 2022-01-02 MED ORDER — ORAL CARE MOUTH RINSE: 15.0000 mL | Freq: Once | OROMUCOSAL | Status: AC

## 2022-01-02 MED ORDER — CHLORHEXIDINE GLUCONATE 0.12 % MT SOLN
15.0000 mL | Freq: Once | OROMUCOSAL | Status: AC
  Administered 2022-01-03: 15 mL via OROMUCOSAL

## 2022-01-03 ENCOUNTER — Encounter
Admission: RE | Disposition: A | Discharge status: Home / Self Care | Payer: Self-pay | Source: Ambulatory Visit | Attending: Obstetrics & Gynecology

## 2022-01-03 ENCOUNTER — Ambulatory Visit: Payer: BLUE CROSS/BLUE SHIELD | Admitting: Anesthesiology

## 2022-01-03 ENCOUNTER — Other Ambulatory Visit: Payer: Self-pay | Admitting: Obstetrics

## 2022-01-03 ENCOUNTER — Encounter: Admission: RE | Payer: Self-pay | Source: Ambulatory Visit

## 2022-01-03 ENCOUNTER — Encounter: Payer: Self-pay | Admitting: Obstetrics & Gynecology

## 2022-01-03 ENCOUNTER — Ambulatory Visit
Admission: RE | Admit: 2022-01-03 | Discharge: 2022-01-03 | Disposition: A | Discharge status: Home / Self Care | Payer: BLUE CROSS/BLUE SHIELD | Source: Ambulatory Visit | Attending: Obstetrics & Gynecology | Admitting: Obstetrics & Gynecology

## 2022-01-03 ENCOUNTER — Other Ambulatory Visit: Payer: Self-pay

## 2022-01-03 DIAGNOSIS — R102 Pelvic and perineal pain: Secondary | ICD-10-CM | POA: Diagnosis not present

## 2022-01-03 DIAGNOSIS — F419 Anxiety disorder, unspecified: Secondary | ICD-10-CM | POA: Insufficient documentation

## 2022-01-03 DIAGNOSIS — Z87891 Personal history of nicotine dependence: Secondary | ICD-10-CM | POA: Diagnosis not present

## 2022-01-03 DIAGNOSIS — N8312 Corpus luteum cyst of left ovary: Secondary | ICD-10-CM | POA: Diagnosis not present

## 2022-01-03 DIAGNOSIS — N879 Dysplasia of cervix uteri, unspecified: Secondary | ICD-10-CM | POA: Diagnosis not present

## 2022-01-03 DIAGNOSIS — N83202 Unspecified ovarian cyst, left side: Secondary | ICD-10-CM

## 2022-01-03 DIAGNOSIS — N838 Other noninflammatory disorders of ovary, fallopian tube and broad ligament: Secondary | ICD-10-CM | POA: Insufficient documentation

## 2022-01-03 DIAGNOSIS — G8929 Other chronic pain: Secondary | ICD-10-CM | POA: Insufficient documentation

## 2022-01-03 DIAGNOSIS — R112 Nausea with vomiting, unspecified: Secondary | ICD-10-CM

## 2022-01-03 DIAGNOSIS — N926 Irregular menstruation, unspecified: Secondary | ICD-10-CM | POA: Diagnosis present

## 2022-01-03 DIAGNOSIS — N809 Endometriosis, unspecified: Secondary | ICD-10-CM | POA: Diagnosis not present

## 2022-01-03 DIAGNOSIS — M549 Dorsalgia, unspecified: Secondary | ICD-10-CM | POA: Insufficient documentation

## 2022-01-03 HISTORY — PX: TOTAL LAPAROSCOPIC HYSTERECTOMY WITH BILATERAL SALPINGO OOPHORECTOMY: SHX6845

## 2022-01-03 HISTORY — PX: CYSTOSCOPY: SHX5120

## 2022-01-03 LAB — POCT PREGNANCY, URINE: Preg Test, Ur: NEGATIVE

## 2022-01-03 LAB — CBC
HCT: 36.5 % (ref 36.0–46.0)
Hemoglobin: 12.4 g/dL (ref 12.0–15.0)
MCH: 28.8 pg (ref 26.0–34.0)
MCHC: 34 g/dL (ref 30.0–36.0)
MCV: 84.7 fL (ref 80.0–100.0)
Platelets: 261 10*3/uL (ref 150–400)
RBC: 4.31 MIL/uL (ref 3.87–5.11)
RDW: 12.6 % (ref 11.5–15.5)
WBC: 7.5 10*3/uL (ref 4.0–10.5)
nRBC: 0 % (ref 0.0–0.2)

## 2022-01-03 LAB — TYPE AND SCREEN
ABO/RH(D): O POS
Antibody Screen: NEGATIVE

## 2022-01-03 SURGERY — HYSTERECTOMY, TOTAL, LAPAROSCOPIC, WITH BILATERAL SALPINGO-OOPHORECTOMY
Anesthesia: Choice

## 2022-01-03 SURGERY — HYSTERECTOMY, TOTAL, LAPAROSCOPIC, WITH BILATERAL SALPINGO-OOPHORECTOMY
Anesthesia: General

## 2022-01-03 MED ORDER — MIDAZOLAM HCL 2 MG/2ML IJ SOLN
INTRAMUSCULAR | Status: AC
  Filled 2022-01-03: qty 2

## 2022-01-03 MED ORDER — KETAMINE HCL 50 MG/5ML IJ SOSY
PREFILLED_SYRINGE | INTRAMUSCULAR | Status: AC
  Filled 2022-01-03: qty 5

## 2022-01-03 MED ORDER — ROCURONIUM BROMIDE 100 MG/10ML IV SOLN
INTRAVENOUS | Status: DC | PRN
  Administered 2022-01-03: 50 mg via INTRAVENOUS
  Administered 2022-01-03: 10 mg via INTRAVENOUS

## 2022-01-03 MED ORDER — ONDANSETRON HCL 4 MG/2ML IJ SOLN: 4.0000 mg | Freq: Once | INTRAMUSCULAR | Status: DC | PRN

## 2022-01-03 MED ORDER — PROPOFOL 10 MG/ML IV BOLUS
INTRAVENOUS | Status: DC | PRN
  Administered 2022-01-03: 150 mg via INTRAVENOUS
  Administered 2022-01-03: 50 mg via INTRAVENOUS

## 2022-01-03 MED ORDER — OXYCODONE-ACETAMINOPHEN 5-325 MG PO TABS: 1.0000 | ORAL_TABLET | ORAL | 0 refills | Status: DC | PRN

## 2022-01-03 MED ORDER — OXYCODONE HCL 5 MG/5ML PO SOLN: 5.0000 mg | Freq: Once | ORAL | Status: AC | PRN

## 2022-01-03 MED ORDER — FENTANYL CITRATE (PF) 100 MCG/2ML IJ SOLN
INTRAMUSCULAR | Status: AC
  Filled 2022-01-03: qty 2

## 2022-01-03 MED ORDER — ROCURONIUM BROMIDE 10 MG/ML (PF) SYRINGE
PREFILLED_SYRINGE | INTRAVENOUS | Status: AC
  Filled 2022-01-03: qty 10

## 2022-01-03 MED ORDER — DIPHENHYDRAMINE HCL 50 MG/ML IJ SOLN
INTRAMUSCULAR | Status: AC
  Filled 2022-01-03: qty 1

## 2022-01-03 MED ORDER — ONDANSETRON HCL 4 MG/2ML IJ SOLN
INTRAMUSCULAR | Status: AC
  Filled 2022-01-03: qty 2

## 2022-01-03 MED ORDER — LIDOCAINE HCL (PF) 2 % IJ SOLN
INTRAMUSCULAR | Status: AC
  Filled 2022-01-03: qty 5

## 2022-01-03 MED ORDER — ACETAMINOPHEN 650 MG RE SUPP
650.0000 mg | RECTAL | Status: DC | PRN
  Filled 2022-01-03: qty 1

## 2022-01-03 MED ORDER — LIDOCAINE HCL (CARDIAC) PF 100 MG/5ML IV SOSY
PREFILLED_SYRINGE | INTRAVENOUS | Status: DC | PRN
  Administered 2022-01-03: 50 mg via INTRAVENOUS

## 2022-01-03 MED ORDER — FENTANYL CITRATE (PF) 100 MCG/2ML IJ SOLN
INTRAMUSCULAR | Status: DC | PRN
  Administered 2022-01-03 (×2): 50 ug via INTRAVENOUS

## 2022-01-03 MED ORDER — PROPOFOL 10 MG/ML IV BOLUS
INTRAVENOUS | Status: AC
  Filled 2022-01-03: qty 20

## 2022-01-03 MED ORDER — HYDROMORPHONE HCL 1 MG/ML IJ SOLN
INTRAMUSCULAR | Status: AC
  Filled 2022-01-03: qty 1

## 2022-01-03 MED ORDER — ONDANSETRON 4 MG PO TBDP: 4.0000 mg | ORAL_TABLET | Freq: Four times a day (QID) | ORAL | 0 refills | Status: DC | PRN

## 2022-01-03 MED ORDER — LACTATED RINGERS IV SOLN: INTRAVENOUS | Status: DC

## 2022-01-03 MED ORDER — ACETAMINOPHEN 10 MG/ML IV SOLN
INTRAVENOUS | Status: AC
  Filled 2022-01-03: qty 100

## 2022-01-03 MED ORDER — CHLORHEXIDINE GLUCONATE 0.12 % MT SOLN
OROMUCOSAL | Status: AC
  Filled 2022-01-03: qty 15

## 2022-01-03 MED ORDER — DIPHENHYDRAMINE HCL 50 MG/ML IJ SOLN
INTRAMUSCULAR | Status: DC | PRN
  Administered 2022-01-03: 12.5 mg via INTRAVENOUS

## 2022-01-03 MED ORDER — HYDROMORPHONE HCL 1 MG/ML IJ SOLN
0.5000 mg | INTRAMUSCULAR | Status: AC | PRN
  Administered 2022-01-03 (×4): 0.5 mg via INTRAVENOUS

## 2022-01-03 MED ORDER — OXYCODONE HCL 5 MG PO TABS
5.0000 mg | ORAL_TABLET | Freq: Once | ORAL | Status: AC | PRN
  Administered 2022-01-03: 5 mg via ORAL

## 2022-01-03 MED ORDER — 0.9 % SODIUM CHLORIDE (POUR BTL) OPTIME
TOPICAL | Status: DC | PRN
  Administered 2022-01-03: 160 mL

## 2022-01-03 MED ORDER — PHENYLEPHRINE HCL (PRESSORS) 10 MG/ML IV SOLN
INTRAVENOUS | Status: DC | PRN
  Administered 2022-01-03: 100 ug via INTRAVENOUS

## 2022-01-03 MED ORDER — DEXAMETHASONE SODIUM PHOSPHATE 10 MG/ML IJ SOLN
INTRAMUSCULAR | Status: AC
  Filled 2022-01-03: qty 1

## 2022-01-03 MED ORDER — GLYCOPYRROLATE 0.2 MG/ML IJ SOLN
INTRAMUSCULAR | Status: AC
  Filled 2022-01-03: qty 1

## 2022-01-03 MED ORDER — SODIUM CHLORIDE 0.9 % IR SOLN
Status: DC | PRN
  Administered 2022-01-03: 200 mL

## 2022-01-03 MED ORDER — FENTANYL CITRATE (PF) 100 MCG/2ML IJ SOLN
25.0000 ug | INTRAMUSCULAR | Status: DC | PRN
  Administered 2022-01-03 (×3): 50 ug via INTRAVENOUS

## 2022-01-03 MED ORDER — OXYCODONE-ACETAMINOPHEN 5-325 MG PO TABS
1.0000 | ORAL_TABLET | ORAL | Status: DC | PRN
Start: 2022-01-03 — End: 2022-01-03

## 2022-01-03 MED ORDER — ACETAMINOPHEN 325 MG PO TABS: 650.0000 mg | ORAL_TABLET | ORAL | Status: DC | PRN

## 2022-01-03 MED ORDER — BUPIVACAINE HCL (PF) 0.5 % IJ SOLN
INTRAMUSCULAR | Status: AC
  Filled 2022-01-03: qty 30

## 2022-01-03 MED ORDER — MIDAZOLAM HCL 2 MG/2ML IJ SOLN
INTRAMUSCULAR | Status: DC | PRN
  Administered 2022-01-03: 2 mg via INTRAVENOUS

## 2022-01-03 MED ORDER — DEXAMETHASONE SODIUM PHOSPHATE 10 MG/ML IJ SOLN
INTRAMUSCULAR | Status: DC | PRN
  Administered 2022-01-03: 6 mg via INTRAVENOUS

## 2022-01-03 MED ORDER — SUGAMMADEX SODIUM 200 MG/2ML IV SOLN
INTRAVENOUS | Status: DC | PRN
  Administered 2022-01-03: 200 mg via INTRAVENOUS

## 2022-01-03 MED ORDER — ONDANSETRON HCL 4 MG/2ML IJ SOLN
INTRAMUSCULAR | Status: DC | PRN
Start: 2022-01-03 — End: 2022-01-03
  Administered 2022-01-03: 4 mg via INTRAVENOUS

## 2022-01-03 MED ORDER — OXYCODONE HCL 5 MG PO TABS
ORAL_TABLET | ORAL | Status: AC
  Filled 2022-01-03: qty 1

## 2022-01-03 MED ORDER — MORPHINE SULFATE (PF) 2 MG/ML IV SOLN: 1.0000 mg | INTRAVENOUS | Status: DC | PRN

## 2022-01-03 MED ORDER — BUPIVACAINE HCL (PF) 0.5 % IJ SOLN
INTRAMUSCULAR | Status: DC | PRN
  Administered 2022-01-03: 11 mL

## 2022-01-03 MED ORDER — ACETAMINOPHEN 10 MG/ML IV SOLN
1000.0000 mg | Freq: Once | INTRAVENOUS | Status: DC | PRN
  Administered 2022-01-03: 1000 mg via INTRAVENOUS

## 2022-01-03 MED ORDER — GLYCOPYRROLATE 0.2 MG/ML IJ SOLN
INTRAMUSCULAR | Status: DC | PRN
  Administered 2022-01-03: .1 mg via INTRAVENOUS

## 2022-01-03 MED ORDER — FAMOTIDINE 20 MG PO TABS
ORAL_TABLET | ORAL | Status: AC
  Administered 2022-01-03: 20 mg via ORAL
  Filled 2022-01-03: qty 1

## 2022-01-03 MED ORDER — DEXMEDETOMIDINE (PRECEDEX) IN NS 20 MCG/5ML (4 MCG/ML) IV SYRINGE
PREFILLED_SYRINGE | INTRAVENOUS | Status: DC | PRN
  Administered 2022-01-03 (×2): 8 ug via INTRAVENOUS
  Administered 2022-01-03: 4 ug via INTRAVENOUS
  Administered 2022-01-03: 8 ug via INTRAVENOUS

## 2022-01-03 MED ORDER — KETAMINE HCL 10 MG/ML IJ SOLN
INTRAMUSCULAR | Status: DC | PRN
  Administered 2022-01-03: 25 mg via INTRAVENOUS

## 2022-01-03 SURGICAL SUPPLY — 61 items
ADH SKN CLS APL DERMABOND .7 (GAUZE/BANDAGES/DRESSINGS) ×1
APL PRP STRL LF DISP 70% ISPRP (MISCELLANEOUS) ×1
APL SRG 38 LTWT LNG FL B (MISCELLANEOUS) ×1
APPLICATOR ARISTA FLEXITIP XL (MISCELLANEOUS) ×1 IMPLANT
BAG DRN RND TRDRP ANRFLXCHMBR (UROLOGICAL SUPPLIES) ×1
BAG LAPAROSCOPIC 12 15 PORT 16 (BASKET) IMPLANT
BAG RETRIEVAL 12/15 (BASKET)
BAG URINE DRAIN 2000ML AR STRL (UROLOGICAL SUPPLIES) ×2 IMPLANT
BLADE SURG SZ11 CARB STEEL (BLADE) ×2 IMPLANT
CATH FOLEY 2WAY  5CC 16FR (CATHETERS) ×1
CATH FOLEY 2WAY 5CC 16FR (CATHETERS) ×1
CATH URTH 16FR FL 2W BLN LF (CATHETERS) ×1 IMPLANT
CHLORAPREP W/TINT 26 (MISCELLANEOUS) ×2 IMPLANT
DEFOGGER SCOPE WARMER CLEARIFY (MISCELLANEOUS) ×2 IMPLANT
DERMABOND ADVANCED (GAUZE/BANDAGES/DRESSINGS) ×1
DERMABOND ADVANCED .7 DNX12 (GAUZE/BANDAGES/DRESSINGS) ×1 IMPLANT
DEVICE SUTURE ENDOST 10MM (ENDOMECHANICALS) ×1 IMPLANT
DRAPE CAMERA CLOSED 9X96 (DRAPES) ×2 IMPLANT
DRSG TEGADERM 2-3/8X2-3/4 SM (GAUZE/BANDAGES/DRESSINGS) ×3 IMPLANT
GAUZE 4X4 16PLY ~~LOC~~+RFID DBL (SPONGE) ×4 IMPLANT
GLOVE SURG ENC MOIS LTX SZ8 (GLOVE) ×6 IMPLANT
GLOVE SURG UNDER LTX SZ8 (GLOVE) ×11 IMPLANT
GOWN STRL REUS W/ TWL LRG LVL3 (GOWN DISPOSABLE) ×4 IMPLANT
GOWN STRL REUS W/ TWL XL LVL3 (GOWN DISPOSABLE) ×3 IMPLANT
GOWN STRL REUS W/TWL LRG LVL3 (GOWN DISPOSABLE) ×8
GOWN STRL REUS W/TWL XL LVL3 (GOWN DISPOSABLE) ×6
GRASPER SUT TROCAR 14GX15 (MISCELLANEOUS) ×2 IMPLANT
HEMOSTAT ARISTA ABSORB 3G PWDR (HEMOSTASIS) ×1 IMPLANT
IRRIGATION STRYKERFLOW (MISCELLANEOUS) ×1 IMPLANT
IRRIGATOR STRYKERFLOW (MISCELLANEOUS) ×2
IV LACTATED RINGERS 1000ML (IV SOLUTION) ×4 IMPLANT
KIT PINK PAD W/HEAD ARE REST (MISCELLANEOUS) ×2
KIT PINK PAD W/HEAD ARM REST (MISCELLANEOUS) ×1 IMPLANT
KIT TURNOVER CYSTO (KITS) ×2 IMPLANT
LABEL OR SOLS (LABEL) ×2 IMPLANT
MANIFOLD NEPTUNE II (INSTRUMENTS) ×2 IMPLANT
MANIPULATOR VCARE LG CRV RETR (MISCELLANEOUS) IMPLANT
MANIPULATOR VCARE SML CRV RETR (MISCELLANEOUS) ×1 IMPLANT
MANIPULATOR VCARE STD CRV RETR (MISCELLANEOUS) ×1 IMPLANT
NEEDLE VERESS 14GA 120MM (NEEDLE) ×2 IMPLANT
NS IRRIG 500ML POUR BTL (IV SOLUTION) ×2 IMPLANT
OCCLUDER COLPOPNEUMO (BALLOONS) ×2 IMPLANT
PACK GYN LAPAROSCOPIC (MISCELLANEOUS) ×2 IMPLANT
PAD OB MATERNITY 4.3X12.25 (PERSONAL CARE ITEMS) ×2 IMPLANT
PAD PREP 24X41 OB/GYN DISP (PERSONAL CARE ITEMS) ×2 IMPLANT
SCISSORS METZENBAUM CVD 33 (INSTRUMENTS) IMPLANT
SCRUB EXIDINE 4% CHG 4OZ (MISCELLANEOUS) ×2 IMPLANT
SET CYSTO W/LG BORE CLAMP LF (SET/KITS/TRAYS/PACK) ×2 IMPLANT
SET TRI-LUMEN FLTR TB AIRSEAL (TUBING) ×2 IMPLANT
SHEARS HARMONIC ACE PLUS 36CM (ENDOMECHANICALS) ×2 IMPLANT
SLEEVE ENDOPATH XCEL 5M (ENDOMECHANICALS) ×2 IMPLANT
SPONGE GAUZE 2X2 8PLY STRL LF (GAUZE/BANDAGES/DRESSINGS) ×3 IMPLANT
SURGILUBE 2OZ TUBE FLIPTOP (MISCELLANEOUS) ×2 IMPLANT
SUT ENDO VLOC 180-0-8IN (SUTURE) ×1 IMPLANT
SUT VIC AB 0 CT1 36 (SUTURE) ×2 IMPLANT
SUT VIC AB 4-0 FS2 27 (SUTURE) ×2 IMPLANT
SYR 10ML LL (SYRINGE) ×2 IMPLANT
SYR 50ML LL SCALE MARK (SYRINGE) ×2 IMPLANT
TROCAR PORT AIRSEAL 8X100 (TROCAR) ×2 IMPLANT
TROCAR XCEL NON-BLD 5MMX100MML (ENDOMECHANICALS) ×2 IMPLANT
WATER STERILE IRR 500ML POUR (IV SOLUTION) ×2 IMPLANT

## 2022-01-03 NOTE — Interval H&P Note (Signed)
History and Physical Interval Note:  01/03/2022 11:21 AM  Elizabeth Peters  has presented today for surgery, with the diagnosis of Pelvic Pain R10.2 Endometriosis N80.9 Left ovarian cyst N83.202.  The various methods of treatment have been discussed with the patient and family. After consideration of risks, benefits and other options for treatment, the patient has consented to  Procedure(s): TOTAL LAPAROSCOPIC HYSTERECTOMY WITH BILATERAL SALPINGECTOMY, LEFT OOPHORECTOMY (N/A) CYSTOSCOPY (N/A) as a surgical intervention.  The patient's history has been reviewed, patient examined, no change in status, stable for surgery.  I have reviewed the patient's chart and labs.  Questions were answered to the patient's satisfaction.     Hoyt Koch

## 2022-01-03 NOTE — Anesthesia Procedure Notes (Signed)
Procedure Name: Intubation Date/Time: 01/03/2022 11:47 AM Performed by: Bea Graff, RN Pre-anesthesia Checklist: Patient identified, Emergency Drugs available, Suction available and Patient being monitored Patient Re-evaluated:Patient Re-evaluated prior to induction Oxygen Delivery Method: Circle system utilized Preoxygenation: Pre-oxygenation with 100% oxygen Induction Type: IV induction Ventilation: Mask ventilation without difficulty Laryngoscope Size: McGraph and 3 Grade View: Grade I Tube type: Oral Tube size: 7.0 mm Number of attempts: 1 Airway Equipment and Method: Stylet and Oral airway Placement Confirmation: ETT inserted through vocal cords under direct vision, positive ETCO2 and breath sounds checked- equal and bilateral Secured at: 21 cm Tube secured with: Tape Dental Injury: Teeth and Oropharynx as per pre-operative assessment

## 2022-01-03 NOTE — Transfer of Care (Signed)
Immediate Anesthesia Transfer of Care Note  Patient: Elizabeth Peters  Procedure(s) Performed: TOTAL LAPAROSCOPIC HYSTERECTOMY WITH BILATERAL SALPINGECTOMY, LEFT OOPHORECTOMY CYSTOSCOPY  Patient Location: PACU  Anesthesia Type:General  Level of Consciousness: drowsy and patient cooperative  Airway & Oxygen Therapy: Patient Spontanous Breathing and Patient connected to face mask oxygen  Post-op Assessment: Report given to RN and Post -op Vital signs reviewed and stable  Post vital signs: Reviewed and stable  Last Vitals:  Vitals Value Taken Time  BP 115/73 01/03/22 1330  Temp 36.4 C 01/03/22 1323  Pulse 76 01/03/22 1332  Resp 21 01/03/22 1332  SpO2 100 % 01/03/22 1332  Vitals shown include unvalidated device data.  Last Pain:  Vitals:   01/03/22 1047  TempSrc: Temporal  PainSc: 5          Complications: No notable events documented.

## 2022-01-03 NOTE — Progress Notes (Signed)
Phone call received from answering service . Elizabeth Peters has been vomiting since her surgery earlier today. Requested something for nausea. Rx for  zofran ODT sent to the pharmacy. Also left a VM for [facility] alerting her to the fact the medication was ordered for her. Imagene Riches, CNM  01/03/2022 9:53 PM

## 2022-01-03 NOTE — Discharge Instructions (Signed)

## 2022-01-03 NOTE — Op Note (Signed)
Operative Note  01/03/2022  PRE-OP DIAGNOSIS:  Pelvic Pain R10.2 Endometriosis N80.9 Left ovarian cyst N83.202   POST-OP DIAGNOSIS: same   PROCEDURE: Procedure(s): TOTAL LAPAROSCOPIC HYSTERECTOMY WITH BILATERAL SALPINGECTOMY, LEFT OOPHORECTOMY CYSTOSCOPY   SURGEON: Barnett Applebaum, MD, FACOG  ASSISTANT: Dr Georgianne Fick, No other capable assistant available, in surgery requiring high level assistant.  ANESTHESIA: Choice   ESTIMATED BLOOD LOSS: Minimal  SPECIMENS:  Cervix, uterus, bilateral tubes, and left ovary.  COMPLICATIONS:  none  DISPOSITION: PACU - hemodynamically stable.  CONDITION: stable  FINDINGS: Exam under anesthesia revealed. Intrabdominal survey revealed Minor adhesions posterior to uterus and left complex cystic ovary   PROCEDURE:  The patient was taken to the OR where anesthesia was administed. She was prepped and draped in the normal sterile fashion in the dorsal lithotomy position in the [place] stirrups. A time out was performed. A Graves speculum was inserted, the cervix was grasped with a single tooth tenaculum and the endometrial cavity was sounded. The cervix was progressively dilated to a size 18 [place] with [employer] dilators. A V-Care uterine manipulator was inserted in the usual fashion without incident. Gloves were changed and attention was turned to the abdomen.   An infraumbilical transverse 8 mm skin incision was made with the scalpel after local anesthesia applied to the skin. A Veress-step needle was inserted in the usual fashion and confirmed using the hanging drop technique. A pneumoperitoneum was obtained by insufflation of CO2 (opening pressure of 35mmHg) to 24mmHg. An 8 mm trocar is then placed under direct visualization with the laparoscope. A diagnostic laparoscopy was performed yielding the previously described findings. Attention was turned to the left lower quadrant where after visualization of the inferior epigastric vessels a 60mm skin incision was  made with the scalpel. A 5 mm laparoscopic port was inserted. The same procedure was repeated in the right lower quadrant with a 33mm trocar. Attention was turned to the left aspect of the uterus, where after visualization of the ureter, the round ligament was coagulated and transected using the 16mm Harmonic Scapel. The anterior and posterior leafs of the broad ligament were dissected off as the anterior one was coagulated and transected in a caudal direction towards the cuff of the uterine manipulator.  Attention was then turned to the left fallopian tube and ovary which was recognized by visualization of the fimbria. The infundibulopelvic ligament and its blood vessels were carefully coagulated and transected using the Harmonic scapel.  Attention was turned to the right aspect of the uterus where the same procedure was performed, however the right ovary and its pedicle is preserved.  The vesicouterine reflection of the peritoneum was dissected with the harmonic scapel and the bladder flap was created bluntly.  The uterine vessels were coagulated and transected bilaterally using first bipolar cautery and then the harmonic scapel. A 360 degree, circumferential colpotomy was done to completely amputate the uterus with cervix and tubes. Once the specimen was amputated it was delivered through the vagina.   The colpotomy was repaired in a simple running fashion using a delayed absorbable suture with an endo-stitch device.  Vaginal exam confirms complete closure.  The cavity was copiously irrigated. A survey of the pelvic cavity revealed adequate hemostasis and no injury to bowel, bladder, or ureter.  The umbilical incision is closed (fascia) with a 0 Vicryl suture using a fascia closure device.  The assistance of my assisting-physician was vital to resect and retract interchangably with self on each side.   A diagnostic cystoscopy was performed  using saline distension of bladder with no lesions or injuries noted.   Bilateral urine flow from each ureteral orifice is visualized.  At this point the procedure was finalized. All the instruments were removed from the patient's body. Gas was expelled and patient is leveled.  Incisions are closed with skin adhesive.    Patient goes to recovery room in stable condition.  All sponge, instrument, and needle counts are correct x2.     Barnett Applebaum, MD, [facility], [facility] 01/03/2022  1:14 PM

## 2022-01-03 NOTE — Anesthesia Postprocedure Evaluation (Signed)
Anesthesia Post Note  Patient: Elizabeth Peters  Procedure(s) Performed: TOTAL LAPAROSCOPIC HYSTERECTOMY WITH BILATERAL SALPINGECTOMY, LEFT OOPHORECTOMY CYSTOSCOPY  Patient location during evaluation: PACU Anesthesia Type: General Level of consciousness: awake and alert Pain management: pain level controlled Vital Signs Assessment: post-procedure vital signs reviewed and stable Respiratory status: spontaneous breathing, nonlabored ventilation, respiratory function stable and patient connected to nasal cannula oxygen Cardiovascular status: blood pressure returned to baseline and stable Postop Assessment: no apparent nausea or vomiting Anesthetic complications: no   No notable events documented.   Last Vitals:  Vitals:   01/03/22 1420 01/03/22 1430  BP:  110/73  Pulse: 62 74  Resp: 11 12  Temp:    SpO2: 97% 100%    Last Pain:  Vitals:   01/03/22 1430  TempSrc:   PainSc: 5                  Molli Barrows

## 2022-01-03 NOTE — Anesthesia Preprocedure Evaluation (Signed)
Anesthesia Evaluation  Patient identified by MRN, date of birth, ID band Patient awake    Reviewed: Allergy & Precautions, NPO status , Patient's Chart, lab work & pertinent test results  History of Anesthesia Complications Negative for: history of anesthetic complications  Airway Mallampati: III   Neck ROM: Full    Dental   Missing upper right and lower left molars:   Pulmonary former smoker (quit 2014),    Pulmonary exam normal breath sounds clear to auscultation       Cardiovascular Exercise Tolerance: Good negative cardio ROS Normal cardiovascular exam Rhythm:Regular Rate:Normal     Neuro/Psych PSYCHIATRIC DISORDERS Anxiety Chronic back pain    GI/Hepatic negative GI ROS,   Endo/Other  negative endocrine ROS  Renal/GU negative Renal ROS     Musculoskeletal   Abdominal   Peds  Hematology negative hematology ROS (+)   Anesthesia Other Findings   Reproductive/Obstetrics Endometriosis                              Anesthesia Physical Anesthesia Plan  ASA: 3  Anesthesia Plan: General   Post-op Pain Management:    Induction: Intravenous  PONV Risk Score and Plan: 3 and Ondansetron, Dexamethasone and Treatment may vary due to age or medical condition  Airway Management Planned: Oral ETT  Additional Equipment:   Intra-op Plan:   Post-operative Plan: Extubation in OR  Informed Consent: I have reviewed the patients History and Physical, chart, labs and discussed the procedure including the risks, benefits and alternatives for the proposed anesthesia with the patient or authorized representative who has indicated his/her understanding and acceptance.     Dental advisory given  Plan Discussed with: CRNA  Anesthesia Plan Comments: (Patient consented for risks of anesthesia including but not limited to:  - adverse reactions to medications - damage to eyes, teeth, lips or  other oral mucosa - nerve damage due to positioning  - sore throat or hoarseness - damage to heart, brain, nerves, lungs, other parts of body or loss of life  Informed patient about role of CRNA in peri- and intra-operative care.  Patient voiced understanding.)        Anesthesia Quick Evaluation

## 2022-01-04 ENCOUNTER — Emergency Department
Admission: EM | Admit: 2022-01-04 | Discharge: 2022-01-04 | Disposition: A | Discharge status: Home / Self Care | Payer: BLUE CROSS/BLUE SHIELD | Attending: Emergency Medicine | Admitting: Emergency Medicine

## 2022-01-04 ENCOUNTER — Encounter: Payer: Self-pay | Admitting: Advanced Practice Midwife

## 2022-01-04 ENCOUNTER — Other Ambulatory Visit: Payer: Self-pay | Admitting: Advanced Practice Midwife

## 2022-01-04 ENCOUNTER — Other Ambulatory Visit: Payer: Self-pay

## 2022-01-04 ENCOUNTER — Emergency Department: Payer: BLUE CROSS/BLUE SHIELD

## 2022-01-04 ENCOUNTER — Telehealth: Payer: Self-pay

## 2022-01-04 ENCOUNTER — Encounter: Payer: Self-pay | Admitting: Emergency Medicine

## 2022-01-04 DIAGNOSIS — R1030 Lower abdominal pain, unspecified: Secondary | ICD-10-CM | POA: Insufficient documentation

## 2022-01-04 DIAGNOSIS — G8918 Other acute postprocedural pain: Secondary | ICD-10-CM | POA: Insufficient documentation

## 2022-01-04 DIAGNOSIS — R112 Nausea with vomiting, unspecified: Secondary | ICD-10-CM

## 2022-01-04 DIAGNOSIS — M549 Dorsalgia, unspecified: Secondary | ICD-10-CM | POA: Diagnosis not present

## 2022-01-04 DIAGNOSIS — Z9889 Other specified postprocedural states: Secondary | ICD-10-CM | POA: Insufficient documentation

## 2022-01-04 DIAGNOSIS — R109 Unspecified abdominal pain: Secondary | ICD-10-CM | POA: Diagnosis not present

## 2022-01-04 DIAGNOSIS — R001 Bradycardia, unspecified: Secondary | ICD-10-CM | POA: Diagnosis not present

## 2022-01-04 LAB — COMPREHENSIVE METABOLIC PANEL
ALT: 10 U/L (ref 0–44)
AST: 16 U/L (ref 15–41)
Albumin: 3.9 g/dL (ref 3.5–5.0)
Alkaline Phosphatase: 59 U/L (ref 38–126)
Anion gap: 7 (ref 5–15)
BUN: 11 mg/dL (ref 6–20)
CO2: 23 mmol/L (ref 22–32)
Calcium: 8.9 mg/dL (ref 8.9–10.3)
Chloride: 103 mmol/L (ref 98–111)
Creatinine, Ser: 0.76 mg/dL (ref 0.44–1.00)
GFR, Estimated: >60 mL/min (ref >60)
Glucose, Bld: 166 mg/dL — ABNORMAL HIGH (ref 70–99)
Potassium: 3.8 mmol/L (ref 3.5–5.1)
Sodium: 133 mmol/L — ABNORMAL LOW (ref 135–145)
Total Bilirubin: 0.8 mg/dL (ref 0.3–1.2)
Total Protein: 7.3 g/dL (ref 6.5–8.1)

## 2022-01-04 LAB — CBC WITH DIFFERENTIAL/PLATELET
Abs Immature Granulocytes: 0.07 10*3/uL (ref 0.00–0.07)
Basophils Absolute: 0 10*3/uL (ref 0.0–0.1)
Basophils Relative: 0 %
Eosinophils Absolute: 0 10*3/uL (ref 0.0–0.5)
Eosinophils Relative: 0 %
HCT: 36.4 % (ref 36.0–46.0)
Hemoglobin: 12.5 g/dL (ref 12.0–15.0)
Immature Granulocytes: 1 %
Lymphocytes Relative: 4 %
Lymphs Abs: 0.6 10*3/uL — ABNORMAL LOW (ref 0.7–4.0)
MCH: 28.7 pg (ref 26.0–34.0)
MCHC: 34.3 g/dL (ref 30.0–36.0)
MCV: 83.7 fL (ref 80.0–100.0)
Monocytes Absolute: 0.5 10*3/uL (ref 0.1–1.0)
Monocytes Relative: 4 %
Neutro Abs: 13.3 10*3/uL — ABNORMAL HIGH (ref 1.7–7.7)
Neutrophils Relative %: 91 %
Platelets: 295 10*3/uL (ref 150–400)
RBC: 4.35 MIL/uL (ref 3.87–5.11)
RDW: 12.5 % (ref 11.5–15.5)
WBC: 14.5 10*3/uL — ABNORMAL HIGH (ref 4.0–10.5)
nRBC: 0 % (ref 0.0–0.2)

## 2022-01-04 LAB — LIPASE, BLOOD: Lipase: 35 U/L (ref 11–51)

## 2022-01-04 LAB — TROPONIN I (HIGH SENSITIVITY)
Troponin I (High Sensitivity): <2 ng/L (ref <18)
Troponin I (High Sensitivity): <2 ng/L (ref <18)

## 2022-01-04 MED ORDER — IOHEXOL 300 MG/ML  SOLN: 100.0000 mL | Freq: Once | INTRAMUSCULAR | Status: DC | PRN

## 2022-01-04 MED ORDER — PANTOPRAZOLE SODIUM 40 MG IV SOLR
40.0000 mg | Freq: Once | INTRAVENOUS | Status: AC
  Administered 2022-01-04: 40 mg via INTRAVENOUS
  Filled 2022-01-04: qty 40

## 2022-01-04 MED ORDER — IOHEXOL 300 MG/ML  SOLN
80.0000 mL | Freq: Once | INTRAMUSCULAR | Status: AC | PRN
  Administered 2022-01-04: 80 mL via INTRAVENOUS

## 2022-01-04 MED ORDER — HYDROMORPHONE HCL 1 MG/ML IJ SOLN
0.5000 mg | Freq: Once | INTRAMUSCULAR | Status: AC
Start: 2022-01-04 — End: 2022-01-04
  Administered 2022-01-04: 0.5 mg via INTRAVENOUS
  Filled 2022-01-04: qty 1

## 2022-01-04 MED ORDER — ONDANSETRON 4 MG PO TBDP: 4.0000 mg | ORAL_TABLET | Freq: Three times a day (TID) | ORAL | 0 refills | Status: DC | PRN

## 2022-01-04 MED ORDER — METOCLOPRAMIDE HCL 5 MG/ML IJ SOLN
10.0000 mg | Freq: Once | INTRAMUSCULAR | Status: AC
Start: 2022-01-04 — End: 2022-01-04
  Administered 2022-01-04: 10 mg via INTRAVENOUS
  Filled 2022-01-04: qty 2

## 2022-01-04 MED ORDER — ONDANSETRON HCL 4 MG/2ML IJ SOLN
4.0000 mg | Freq: Once | INTRAMUSCULAR | Status: AC
  Administered 2022-01-04: 4 mg via INTRAVENOUS
  Filled 2022-01-04: qty 2

## 2022-01-04 MED ORDER — SODIUM CHLORIDE 0.9 % IV BOLUS
1000.0000 mL | Freq: Once | INTRAVENOUS | Status: AC
  Administered 2022-01-04: 1000 mL via INTRAVENOUS

## 2022-01-04 MED ORDER — PROMETHAZINE HCL 25 MG RE SUPP: 25.0000 mg | Freq: Four times a day (QID) | RECTAL | 0 refills | Status: DC | PRN

## 2022-01-04 MED ORDER — METOCLOPRAMIDE HCL 10 MG PO TABS: 10.0000 mg | ORAL_TABLET | Freq: Three times a day (TID) | ORAL | 0 refills | Status: DC

## 2022-01-04 MED ORDER — HALOPERIDOL LACTATE 5 MG/ML IJ SOLN: 1.0000 mg | Freq: Once | INTRAMUSCULAR | Status: DC | PRN

## 2022-01-04 NOTE — Telephone Encounter (Signed)
PT is calling in for RX for nausea.Current is making her sick.

## 2022-01-04 NOTE — Telephone Encounter (Signed)
Pt calling stating that she had a hysterectomy yesterday and she can not keep the pain meds down even with the nausea meds. Can you send in something differen? Since [facility] is not in the office today.

## 2022-01-04 NOTE — ED Provider Notes (Addendum)
[facility] Provider Note    Event Date/Time   First MD Initiated Contact with Patient 01/04/22 [phone]     (approximate)   History   Post-op Problem   HPI  Elizabeth Peters is a 41 y.o. female with endometriosis who is status post vaginal hysterectomy yesterday by Dr. Kenton Kingfisher at approximately 4 PM who comes in with concern for nausea vomiting lower abdominal and back pain.  Patient was prescribed oxycodone for discharge home but states that she was not able to keep any down due to the severe pain.  She is also had severe nausea and vomiting, multiple episodes she was prescribed some Zofran but was throwing that up as well.  Patient's girlfriend brought her in and stated that she has had multiple surgeries in the past but never had a reaction like this before.   Physical Exam   Triage Vital Signs: ED Triage Vitals  Enc Vitals Group     BP 01/04/22 0301 (!) 146/69     Pulse Rate 01/04/22 0301 (!) 48     Resp 01/04/22 0301 18     Temp 01/04/22 0301 97.6 F (36.4 C)     Temp Source 01/04/22 0301 Axillary     SpO2 01/04/22 0301 100 %     Weight 01/04/22 0256 139 lb 15.9 oz (63.5 kg)     Height 01/04/22 0256 5\' 5"  (1.651 m)     Head Circumference --      Peak Flow --      Pain Score 01/04/22 0256 10     Pain Loc --      Pain Edu? --      Excl. in [place]? --     Most recent vital signs: Vitals:   01/04/22 0301  BP: (!) 146/69  Pulse: (!) 48  Resp: 18  Temp: 97.6 F (36.4 C)  SpO2: 100%     General: Awake, no distress.  Appears uncomfortable CV:  Good peripheral perfusion.  Bradycardic Resp:  Normal effort.  Abd:  No distention.  Tender on the right side.  Bandages over her laparoscopic incisions    ED Results / Procedures / Treatments   Labs (all labs ordered are listed, but only abnormal results are displayed) Labs Reviewed  CBC WITH DIFFERENTIAL/PLATELET - Abnormal; Notable for the following components:      Result Value   WBC 14.5 (*)     Neutro Abs 13.3 (*)    Lymphs Abs 0.6 (*)    All other components within normal limits  COMPREHENSIVE METABOLIC PANEL  LIPASE, BLOOD  URINALYSIS, ROUTINE W REFLEX MICROSCOPIC     EKG  My interpretation of EKG:  Normal sinus rate of 73 without any ST elevation or T wave inversions, normal intervals   RADIOLOGY I have reviewed the xray personally and agree with radiology read   IMPRESSION: 1. Pneumoperitoneum and other expected postoperative changes from laparoscopic hysterectomy yesterday. No complication is identified. 2. Gas within the urinary bladder, probably from recent catheterization rather than gas-forming UTI.     PROCEDURES:  Critical Care performed: No  .1-3 Lead EKG Interpretation Performed by: Vanessa [place], MD Authorized by: Vanessa [place], MD     Interpretation: abnormal     ECG rate:  50s   ECG rate assessment: bradycardic     Rhythm: sinus bradycardia     Ectopy: none     Conduction: normal     MEDICATIONS ORDERED IN ED: Medications  sodium chloride 0.9 %  bolus 1,000 mL (1,000 mLs Intravenous New Bag/Given 01/04/22 0355)  HYDROmorphone (DILAUDID) injection 0.5 mg (0.5 mg Intravenous Given 01/04/22 0355)  ondansetron (ZOFRAN) injection 4 mg (4 mg Intravenous Given 01/04/22 0355)  iohexol (OMNIPAQUE) 300 MG/ML solution 80 mL (80 mLs Intravenous Contrast Given 01/04/22 0405)  metoCLOPramide (REGLAN) injection 10 mg (10 mg Intravenous Given 01/04/22 2542)     IMPRESSION / MDM / ASSESSMENT AND PLAN / ED COURSE  I reviewed the triage vital signs and the nursing notes.   Patiently recently postop who comes in with severe abdominal pain Differential diagnosis includes, but is not limited to, most likely secondary to postop pain versus ileus versus obstruction versus perforation.  We will get CT imaging to further evaluate.  We will give patient some IV Dilaudid, IV Zofran and IV fluids while pending results.  Lipase no evidence of pancreatitis CBC [place]  count slightly elevated but this could just be from the recent surgery.  No evidence of anemia CMP shows slightly low sodium.  Patient getting IV fluids  CT imaging shows signs of her recent surgery but nothing that looks like complications such as obstruction or ileus.  Patient is starting to dry heave again.  Patient will get be given IV Reglan  Patient to be handed off to oncoming team pending p.o. challenge and reevaluation.  We will consider admission if she is unable to tolerate p.o.  Patient concerned of some acid reflux symptoms.  Added on troponins and will give some IV Protonix.   The patient is on the cardiac monitor to evaluate for evidence of arrhythmia and/or significant heart rate changes     FINAL CLINICAL IMPRESSION(S) / ED DIAGNOSES   Final diagnoses:  Post-op pain  Post-operative nausea and vomiting     Rx / DC Orders   ED Discharge Orders     None        Note:  This document was prepared using Dragon voice recognition software and may include unintentional dictation errors.   Vanessa [place], MD 01/04/22 [id number]    Vanessa [place], MD 01/04/22 [id number]    Vanessa Webb, MD 01/04/22 [phone]

## 2022-01-04 NOTE — Discharge Instructions (Addendum)
Please return or follow-up with Dr. Kenton Kingfisher for any worse pain or vomiting that will not stop.  Try the Zofran melt on your tongue wafers 1 3 times a day as needed for nausea or vomiting.  I would try clear liquids and small amounts for the next hour or 2.  That would be like a teaspoon every 5 or 10 minutes.  If you tolerate that okay to try the brat diet bananas, rice, applesauce and toast.  Normal on that again small amounts frequently and if that does well resume your regular diet.

## 2022-01-04 NOTE — ED Triage Notes (Signed)
Patient ambulatory to triage, appears uncomfortable, grimacing, restless; Vag hysterectomy yesterday by Dr Kenton Kingfisher, d/c approx 4pm; c/o N/V, lower abd & back pain

## 2022-01-04 NOTE — ED Provider Notes (Signed)
----------------------------------------- °  9:43 AM on 01/04/2022 ----------------------------------------- Patient feeling a lot better.  Only little bit of nausea left.  She feels comfortable trying to go home.  We will let her do so.  Dr. Kenton Kingfisher has been in to see her as well.   Nena Polio, MD 01/04/22 [phone]

## 2022-01-04 NOTE — Progress Notes (Signed)
Rx phenergan suppository sent

## 2022-01-04 NOTE — ED Notes (Signed)
RN to bedside to introduce self to pt. Pt sleeping. Girlfriend at bedside.

## 2022-01-07 LAB — SURGICAL PATHOLOGY

## 2022-01-10 DIAGNOSIS — M79606 Pain in leg, unspecified: Secondary | ICD-10-CM | POA: Diagnosis not present

## 2022-01-10 DIAGNOSIS — Z5321 Procedure and treatment not carried out due to patient leaving prior to being seen by health care provider: Secondary | ICD-10-CM | POA: Diagnosis not present

## 2022-01-11 ENCOUNTER — Ambulatory Visit (INDEPENDENT_AMBULATORY_CARE_PROVIDER_SITE_OTHER): Payer: BLUE CROSS/BLUE SHIELD | Admitting: Obstetrics & Gynecology

## 2022-01-11 ENCOUNTER — Ambulatory Visit
Admission: RE | Admit: 2022-01-11 | Discharge: 2022-01-11 | Disposition: A | Discharge status: Home / Self Care | Payer: BLUE CROSS/BLUE SHIELD | Source: Ambulatory Visit | Attending: Obstetrics & Gynecology | Admitting: Obstetrics & Gynecology

## 2022-01-11 ENCOUNTER — Telehealth: Payer: Self-pay

## 2022-01-11 ENCOUNTER — Other Ambulatory Visit: Payer: Self-pay

## 2022-01-11 ENCOUNTER — Encounter: Payer: Self-pay | Admitting: Obstetrics & Gynecology

## 2022-01-11 VITALS — BP 120/80 | Ht 65.0 in | Wt 139.0 lb

## 2022-01-11 DIAGNOSIS — M79661 Pain in right lower leg: Secondary | ICD-10-CM | POA: Insufficient documentation

## 2022-01-11 DIAGNOSIS — Z90710 Acquired absence of both cervix and uterus: Secondary | ICD-10-CM

## 2022-01-11 DIAGNOSIS — M79604 Pain in right leg: Secondary | ICD-10-CM | POA: Diagnosis not present

## 2022-01-11 DIAGNOSIS — M79662 Pain in left lower leg: Secondary | ICD-10-CM

## 2022-01-11 DIAGNOSIS — M79605 Pain in left leg: Secondary | ICD-10-CM | POA: Diagnosis not present

## 2022-01-11 NOTE — Telephone Encounter (Signed)
Pt called after hour nurse last night 7:34pm; is one wk post op after hyst; legs feel very thick and heavy, weird feeling and is vomiting; getting up every hour to walk; is in pain in the incision; chills no fever; had covid a week before surgery. After hour nurse adv her to go to [facility]  when pt was called she was in our waiting room; she went to UC in [place] they had no one to do an u/s on her legs and sent her to [place] labs, no one to do an u/s; documentation in Care Everywhere; pt states she cannot stay in an ER as she is too restless from the pain; is not able to keep pain med down. Adv to stay and see if [facility] can see her hopefully.

## 2022-01-11 NOTE — Progress Notes (Signed)
°  Postoperative Follow-up Patient presents post op from  [facility]  for abnormal uterine bleeding and fibroids, 1 week ago.  Subjective: Patient reports some improvement in her post op nausea symptoms.  She reports yesterday having bilateral calf and leg (behind the knee) pain.  She feels restless in the legs.  She walks daily and multiple times daily.  Denies CP, SOB, fever.  Low appetite, due to nausea.  Is taking Phenergan to help.   Eating a regular diet  cut low volume . The patient is not having any pain.  Activity: normal activities of daily living. Patient reports additional symptom's since surgery of No bleeding  Objective: BP 120/80    Ht 5\' 5"  (1.651 m)    Wt 139 lb (63 kg)    LMP 12/04/2021 (Exact Date)    BMI 23.13 kg/m  Physical Exam Constitutional:      General: She is not in acute distress.    Appearance: She is well-developed.  Cardiovascular:     Rate and Rhythm: Normal rate.     Pulses: Normal pulses.  Pulmonary:     Effort: Pulmonary effort is normal.  Abdominal:     General: There is no distension.     Palpations: Abdomen is soft.     Tenderness: There is no abdominal tenderness.     Comments: Incision Healing Well   Musculoskeletal:        General: Normal range of motion.     Right lower leg: No edema.     Left lower leg: No edema.     Comments: Tenderness to palpation with both calves and tendons behind knee.  No palpable cords or mass.  No erythema or skin warmth.  Neurological:     Mental Status: She is alert and oriented to person, place, and time.     Cranial Nerves: No cranial nerve deficit.  Skin:    General: Skin is warm and dry.    Assessment: s/p :  total laparoscopic hysterectomy with bilateral salpingectomy stable Pt had elevated D Dimer in ER last night, and calf T.  Will order stat LE Dopplers to assess for DVT.  Differential includes muscle strain.  Monitor for s/sx PE.  Risks discussed. Cont to try to advance diet.  Hoyt Koch 01/11/2022, 9:15 AM

## 2022-01-11 NOTE — Telephone Encounter (Signed)
Called pt to advise of time and location for STAT DVT U/S

## 2022-01-14 ENCOUNTER — Other Ambulatory Visit: Payer: Self-pay | Admitting: Advanced Practice Midwife

## 2022-01-14 ENCOUNTER — Telehealth: Payer: Self-pay

## 2022-01-14 DIAGNOSIS — G4700 Insomnia, unspecified: Secondary | ICD-10-CM

## 2022-01-14 MED ORDER — TRAZODONE HCL 50 MG PO TABS: 50.0000 mg | ORAL_TABLET | Freq: Every day | ORAL | 1 refills | Status: DC

## 2022-01-14 NOTE — Telephone Encounter (Signed)
Pt calling for rx for sleep; only sleeps 1-2 hours a night then she is up the rest of the day; s/p hyst earlier this month.  [phone]

## 2022-01-14 NOTE — Progress Notes (Signed)
Short term order of trazodone sent for patient with c/o insomnia since surgery.

## 2022-01-15 NOTE — Telephone Encounter (Signed)
Left detailed msg.

## 2022-01-17 ENCOUNTER — Encounter: Payer: BLUE CROSS/BLUE SHIELD | Admitting: Obstetrics & Gynecology

## 2022-02-12 ENCOUNTER — Encounter: Payer: Self-pay | Admitting: Obstetrics & Gynecology

## 2022-02-12 ENCOUNTER — Ambulatory Visit (INDEPENDENT_AMBULATORY_CARE_PROVIDER_SITE_OTHER): Payer: BLUE CROSS/BLUE SHIELD | Admitting: Obstetrics & Gynecology

## 2022-02-12 ENCOUNTER — Other Ambulatory Visit: Payer: Self-pay

## 2022-02-12 VITALS — BP 120/80 | Ht 65.0 in | Wt 140.0 lb

## 2022-02-12 DIAGNOSIS — Z90710 Acquired absence of both cervix and uterus: Secondary | ICD-10-CM

## 2022-02-12 NOTE — Progress Notes (Signed)
°  Postoperative Follow-up Patient presents post op from  [facility]  for pelvic pain, 6 weeks ago. Path: DIAGNOSIS:  A. UTERUS WITH CERVIX, BILATERAL FALLOPIAN TUBES AND LEFT OVARY; TOTAL  HYSTERECTOMY WITH BILATERAL SALPINGECTOMY AND UNILATERAL OOPHORECTOMY:  - CERVIX WITH FOCAL SQUAMOUS METAPLASIA.  - SECRETORY ENDOMETRIUM.  - UNREMARKABLE MYOMETRIUM.  - BILATERAL FALLOPIAN TUBES WITH FIMBRIATED END.  - BENIGN RIGHT PARATUBAL CYST.  - LEFT OVARY WITH INVOLUTING CORPUS LUTEUM AND CYSTIC FOLLICLES.  - NEGATIVE FOR ATYPIA AND MALIGNANCY.  Subjective: Patient reports marked improvement in her preop symptoms. Eating a regular diet without difficulty. The patient is not having any pain.  Activity: normal activities of daily living. Patient reports additional symptom's since surgery of No bleeding.  Objective: BP 120/80    Ht 5\' 5"  (1.651 m)    Wt 140 lb (63.5 kg)    LMP 12/04/2021 (Exact Date)    BMI 23.30 kg/m  Physical Exam Constitutional:      General: She is not in acute distress.    Appearance: She is well-developed.  Genitourinary:     Rectum normal.     Genitourinary Comments: Cervix and uterus absent. Vaginal cuff healing well.     No vaginal erythema or bleeding.      Right Adnexa: not tender and no mass present.    Left Adnexa: not tender and no mass present.    Cervix is absent.     Uterus is absent.  Cardiovascular:     Rate and Rhythm: Normal rate.  Pulmonary:     Effort: Pulmonary effort is normal.  Abdominal:     General: There is no distension.     Palpations: Abdomen is soft.     Tenderness: There is no abdominal tenderness.     Comments: Incision healing well.  Musculoskeletal:        General: Normal range of motion.  Neurological:     Mental Status: She is alert and oriented to person, place, and time.     Cranial Nerves: No cranial nerve deficit.  Skin:    General: Skin is warm and dry.    Assessment: s/p :  total laparoscopic hysterectomy with  bilateral salpingectomy progressing well  Plan: Patient has done well after surgery with no apparent complications.  I have discussed the post-operative course to date, and the expected progress moving forward.  The patient understands what complications to be concerned about.  I will see the patient in routine follow up, or sooner if needed.    Activity plan: No restriction.  Hoyt Koch 02/12/2022, 2:47 PM

## 2022-04-11 ENCOUNTER — Telehealth: Payer: Self-pay

## 2022-04-11 NOTE — Telephone Encounter (Signed)
LVM for pt to call office to schedule colonoscopy with Dr. Marius Ditch. ? ?Thanks, ?Sharyn Lull, CMA ?

## 2022-04-11 NOTE — Telephone Encounter (Signed)
Patient returned your cal. Requesting a call back. ?

## 2022-04-11 NOTE — Telephone Encounter (Signed)
LVM for patient to call office back. ? ?Thanks, ?Sharyn Lull, CMA ?

## 2022-04-12 ENCOUNTER — Telehealth: Payer: Self-pay

## 2022-04-12 ENCOUNTER — Other Ambulatory Visit: Payer: Self-pay

## 2022-04-12 NOTE — Telephone Encounter (Signed)
CALLED PATIENT NO ANSWER LEFT VOICEMAIL FOR A CALL BACK °Letter sent °

## 2022-04-12 NOTE — Telephone Encounter (Signed)
LVM for pt to return my call.   Thanks,  Elianis Fischbach, CMA 

## 2022-04-15 ENCOUNTER — Ambulatory Visit (INDEPENDENT_AMBULATORY_CARE_PROVIDER_SITE_OTHER): Payer: Self-pay | Admitting: Gastroenterology

## 2022-04-15 ENCOUNTER — Other Ambulatory Visit: Payer: Self-pay

## 2022-04-15 ENCOUNTER — Encounter: Payer: Self-pay | Admitting: Gastroenterology

## 2022-04-15 VITALS — BP 157/78 | HR 90 | Temp 98.3°F | Ht 65.0 in | Wt 135.0 lb

## 2022-04-15 DIAGNOSIS — R634 Abnormal weight loss: Secondary | ICD-10-CM

## 2022-04-15 DIAGNOSIS — R109 Unspecified abdominal pain: Secondary | ICD-10-CM

## 2022-04-15 DIAGNOSIS — R194 Change in bowel habit: Secondary | ICD-10-CM

## 2022-04-15 DIAGNOSIS — R197 Diarrhea, unspecified: Secondary | ICD-10-CM

## 2022-04-15 NOTE — Addendum Note (Signed)
Addended by: Lurlean Nanny on: 04/15/2022 04:23 PM ? ? Modules accepted: Orders ? ?

## 2022-04-15 NOTE — Progress Notes (Signed)
? ? ?Gastroenterology Consultation ? ?Referring Provider:     No ref. provider found ?Primary Care Physician:  Pcp, No ?Primary Gastroenterologist:  Dr. Allen Norris     ?Reason for Consultation:     Weight loss with early satiety ?      ? HPI:   ?Elizabeth Peters is a 41 y.o. y/o female referred for consultation & management of weight loss with early satiety by Dr. Merryl Hacker, No.  This patient comes in after seeing me back in 2007 for colonoscopy.  She does not recall what the colonoscopy was for at that time.  The patient has reported that for the past year she has been having problems. ? ?Last year the patient was over 200 lbs and now came comes in at 135 lbs. She says that she kept loosing weight and could only eat half of her food and now she reports that she is not able to eat except apple sauce, mashed potatoes, mack and cheese and still feels full. She she also has diarrhea with pencil stools. She has a choking sensation. ? ?IMPRESSION: ?1. Pneumoperitoneum and other expected postoperative changes from ?laparoscopic hysterectomy yesterday. No complication is identified. ?2. Gas within the urinary bladder, probably from recent ?catheterization rather than gas-forming UTI. ? ?She was given Linzess by Dr. Haig Prophet and she says she did not take it. ? ?Although the patient has a history of constipation which she states she believes to have been her norm she started to have more frequent bowel movements and diarrhea.  The diarrhea although happens multiple times a day does not happen at night.  She also reports that before her hysterectomy she was having symptoms worse with her menstrual cycle.  The patient states that she feels full after a couple of bites of food and then she has pain that radiates to her back and she has to stop eating at that time. ? ?Past Medical History:  ?Diagnosis Date  ? Anxiety   ? Brain tumor (benign) ([place]) 2011  ? Chronic pain due to injury   ? spine  ? Endometriosis   ? ? ?Past Surgical History:   ?Procedure Laterality Date  ? BRAIN SURGERY  2011  ? tumor  ? CYSTOSCOPY N/A 01/03/2022  ? Procedure: CYSTOSCOPY;  Surgeon: Gae Dry, MD;  Location: [facility] ORS;  Service: Gynecology;  Laterality: N/A;  ? [place] OF UTERUS  2007  ? neck fusion    ? SHOULDER SURGERY Left 2011  ? SPINE SURGERY  2013  ? neck fulsion  ? TONSILLECTOMY  2008  ? TOTAL LAPAROSCOPIC HYSTERECTOMY WITH BILATERAL SALPINGO OOPHORECTOMY N/A 01/03/2022  ? Procedure: TOTAL LAPAROSCOPIC HYSTERECTOMY WITH BILATERAL SALPINGECTOMY, LEFT OOPHORECTOMY;  Surgeon: Gae Dry, MD;  Location: [facility] ORS;  Service: Gynecology;  Laterality: N/A;  ? ? ?Prior to Admission medications   ?Medication Sig Start Date End Date Taking? Authorizing Provider  ?metoCLOPramide (REGLAN) 10 MG tablet Take 1 tablet (10 mg total) by mouth 3 (three) times daily with meals. ?Patient not taking: Reported on 02/12/2022 01/04/22 01/04/23  Nena Polio, MD  ?NON FORMULARY Take 10 mg by mouth at bedtime. Delta 8 (thc gummies) ?Patient not taking: Reported on 02/12/2022    [provider]  ?ondansetron (ZOFRAN-ODT) 4 MG disintegrating tablet Take 1 tablet (4 mg total) by mouth every 6 (six) hours as needed for nausea. ?Patient not taking: Reported on 02/12/2022 01/03/22   Imagene Riches, CNM  ?ondansetron (ZOFRAN-ODT) 4 MG disintegrating tablet Take 1  tablet (4 mg total) by mouth every 8 (eight) hours as needed for nausea or vomiting. ?Patient not taking: Reported on 02/12/2022 01/04/22   Nena Polio, MD  ?oxyCODONE-acetaminophen (PERCOCET/ROXICET) 5-325 MG tablet Take 1 tablet by mouth every 4 (four) hours as needed for moderate pain. ?Patient not taking: Reported on 02/12/2022 01/03/22   Gae Dry, MD  ?promethazine (PHENERGAN) 25 MG suppository Place 1 suppository (25 mg total) rectally every 6 (six) hours as needed for nausea or vomiting. ?Patient not taking: Reported on 02/12/2022 01/04/22   Rod Can, CNM  ?traZODone (DESYREL) 50 MG  tablet Take 1 tablet (50 mg total) by mouth at bedtime. ?Patient not taking: Reported on 02/12/2022 01/14/22   Rod Can, CNM  ? ? ?Family History  ?Problem Relation Age of Onset  ? Diabetes Mother   ? Cancer Mother   ? Breast cancer Mother 38  ? Cancer Father   ? Skin cancer Father   ? Heart attack Father   ? Stroke Paternal Grandmother   ? Diabetes Paternal Grandmother   ? Cancer Paternal Grandmother   ? Colon cancer Paternal Grandmother   ? Cancer Paternal Grandfather   ? Lung cancer Paternal Grandfather   ? Breast cancer Cousin   ?     maternal side early 27's  ?  ? ?Social History  ? ?Tobacco Use  ? Smoking status: Former  ?  Years: 4.00  ?  Types: Cigarettes  ?  Quit date: 12/16/2012  ?  Years since quitting: 9.3  ? Smokeless tobacco: Never  ?Vaping Use  ? Vaping Use: Never used  ?Substance Use Topics  ? Alcohol use: No  ?  Alcohol/week: 0.0 standard drinks  ? Drug use: No  ? ? ?Allergies as of 04/15/2022 - Review Complete 02/12/2022  ?Allergen Reaction Noted  ? Aspirin Anaphylaxis 06/07/2015  ? Penicillins Anaphylaxis 06/07/2015  ? ? ?Review of Systems:    ?All systems reviewed and negative except where noted in HPI. ? ? Physical Exam:  ?LMP 12/04/2021 (Exact Date)  ?Patient's last menstrual period was 12/04/2021 (exact date). ?General:   Alert,  Well-developed, well-nourished, pleasant and cooperative in NAD ?Head:  Normocephalic and atraumatic. ?Eyes:  Sclera clear, no icterus.   Conjunctiva pink. ?Ears:  Normal auditory acuity. ?Neck:  Supple; no masses or thyromegaly. ?Lungs:  Respirations even and unlabored.  Clear throughout to auscultation.   No wheezes, crackles, or rhonchi. No acute distress. ?Heart:  Regular rate and rhythm; no murmurs, clicks, rubs, or gallops. ?Abdomen:  Normal bowel sounds.  No bruits.  Soft, positive tenderness diffusely and non-distended without masses, hepatosplenomegaly or hernias noted.  No guarding or rebound tenderness.  Negative Carnett sign.   ?Rectal:  Deferred.   ?Pulses:  Normal pulses noted. ?Extremities:  No clubbing or edema.  No cyanosis. ?Neurologic:  Alert and oriented x3;  grossly normal neurologically. ?Skin:  Intact without significant lesions or rashes.  No jaundice. ?Lymph Nodes:  No significant cervical adenopathy. ?Psych:  Alert and cooperative. Normal mood and affect. ? ?Imaging Studies: ?No results found. ? ?Assessment and Plan:  ? ?Elizabeth Peters is a 41 y.o. y/o female who comes in today with a change in bowel habits and weight loss of 65 pounds with early satiety and had seen Dr. Haig Prophet 2 weeks ago because that is her mother's doctor but was seen by her primary care provider who told her that she should see me.  The patient is a retired Engineer, structural.  Patient  will be set up for an EGD and colonoscopy due to early satiety, change in bowel habits and weight loss.  The patient has been told that this may represent functional bowel disorder but the weight loss is concerning.  Patient also reports that she has worse symptoms after she eats meat so we will have her blood sent off for alpha gal.  The patient has been explained the plan and agrees with it. ? ? ? ?Lucilla Lame, MD. Marval Regal ? ? ? Note: This dictation was prepared with Dragon dictation along with smaller phrase technology. Any transcriptional errors that result from this process are unintentional.   ?

## 2022-04-15 NOTE — H&P (View-Only) (Signed)
? ? ?Gastroenterology Consultation ? ?Referring Provider:     No ref. provider found ?Primary Care Physician:  Pcp, No ?Primary Gastroenterologist:  Dr. Allen Norris     ?Reason for Consultation:     Weight loss with early satiety ?      ? HPI:   ?CAPRIA CARTAYA is a 41 y.o. y/o female referred for consultation & management of weight loss with early satiety by Dr. Merryl Hacker, No.  This patient comes in after seeing me back in 2007 for colonoscopy.  She does not recall what the colonoscopy was for at that time.  The patient has reported that for the past year she has been having problems. ? ?Last year the patient was over 200 lbs and now came comes in at 135 lbs. She says that she kept loosing weight and could only eat half of her food and now she reports that she is not able to eat except apple sauce, mashed potatoes, mack and cheese and still feels full. She she also has diarrhea with pencil stools. She has a choking sensation. ? ?IMPRESSION: ?1. Pneumoperitoneum and other expected postoperative changes from ?laparoscopic hysterectomy yesterday. No complication is identified. ?2. Gas within the urinary bladder, probably from recent ?catheterization rather than gas-forming UTI. ? ?She was given Linzess by Dr. Haig Prophet and she says she did not take it. ? ?Although the patient has a history of constipation which she states she believes to have been her norm she started to have more frequent bowel movements and diarrhea.  The diarrhea although happens multiple times a day does not happen at night.  She also reports that before her hysterectomy she was having symptoms worse with her menstrual cycle.  The patient states that she feels full after a couple of bites of food and then she has pain that radiates to her back and she has to stop eating at that time. ? ?Past Medical History:  ?Diagnosis Date  ? Anxiety   ? Brain tumor (benign) ([place]) 2011  ? Chronic pain due to injury   ? spine  ? Endometriosis   ? ? ?Past Surgical History:   ?Procedure Laterality Date  ? BRAIN SURGERY  2011  ? tumor  ? CYSTOSCOPY N/A 01/03/2022  ? Procedure: CYSTOSCOPY;  Surgeon: Gae Dry, MD;  Location: [facility] ORS;  Service: Gynecology;  Laterality: N/A;  ? [place] OF UTERUS  2007  ? neck fusion    ? SHOULDER SURGERY Left 2011  ? SPINE SURGERY  2013  ? neck fulsion  ? TONSILLECTOMY  2008  ? TOTAL LAPAROSCOPIC HYSTERECTOMY WITH BILATERAL SALPINGO OOPHORECTOMY N/A 01/03/2022  ? Procedure: TOTAL LAPAROSCOPIC HYSTERECTOMY WITH BILATERAL SALPINGECTOMY, LEFT OOPHORECTOMY;  Surgeon: Gae Dry, MD;  Location: [facility] ORS;  Service: Gynecology;  Laterality: N/A;  ? ? ?Prior to Admission medications   ?Medication Sig Start Date End Date Taking? Authorizing Provider  ?metoCLOPramide (REGLAN) 10 MG tablet Take 1 tablet (10 mg total) by mouth 3 (three) times daily with meals. ?Patient not taking: Reported on 02/12/2022 01/04/22 01/04/23  Nena Polio, MD  ?NON FORMULARY Take 10 mg by mouth at bedtime. Delta 8 (thc gummies) ?Patient not taking: Reported on 02/12/2022    [provider]  ?ondansetron (ZOFRAN-ODT) 4 MG disintegrating tablet Take 1 tablet (4 mg total) by mouth every 6 (six) hours as needed for nausea. ?Patient not taking: Reported on 02/12/2022 01/03/22   Imagene Riches, CNM  ?ondansetron (ZOFRAN-ODT) 4 MG disintegrating tablet Take 1  tablet (4 mg total) by mouth every 8 (eight) hours as needed for nausea or vomiting. ?Patient not taking: Reported on 02/12/2022 01/04/22   Nena Polio, MD  ?oxyCODONE-acetaminophen (PERCOCET/ROXICET) 5-325 MG tablet Take 1 tablet by mouth every 4 (four) hours as needed for moderate pain. ?Patient not taking: Reported on 02/12/2022 01/03/22   Gae Dry, MD  ?promethazine (PHENERGAN) 25 MG suppository Place 1 suppository (25 mg total) rectally every 6 (six) hours as needed for nausea or vomiting. ?Patient not taking: Reported on 02/12/2022 01/04/22   Rod Can, CNM  ?traZODone (DESYREL) 50 MG  tablet Take 1 tablet (50 mg total) by mouth at bedtime. ?Patient not taking: Reported on 02/12/2022 01/14/22   Rod Can, CNM  ? ? ?Family History  ?Problem Relation Age of Onset  ? Diabetes Mother   ? Cancer Mother   ? Breast cancer Mother 48  ? Cancer Father   ? Skin cancer Father   ? Heart attack Father   ? Stroke Paternal Grandmother   ? Diabetes Paternal Grandmother   ? Cancer Paternal Grandmother   ? Colon cancer Paternal Grandmother   ? Cancer Paternal Grandfather   ? Lung cancer Paternal Grandfather   ? Breast cancer Cousin   ?     maternal side early 34's  ?  ? ?Social History  ? ?Tobacco Use  ? Smoking status: Former  ?  Years: 4.00  ?  Types: Cigarettes  ?  Quit date: 12/16/2012  ?  Years since quitting: 9.3  ? Smokeless tobacco: Never  ?Vaping Use  ? Vaping Use: Never used  ?Substance Use Topics  ? Alcohol use: No  ?  Alcohol/week: 0.0 standard drinks  ? Drug use: No  ? ? ?Allergies as of 04/15/2022 - Review Complete 02/12/2022  ?Allergen Reaction Noted  ? Aspirin Anaphylaxis 06/07/2015  ? Penicillins Anaphylaxis 06/07/2015  ? ? ?Review of Systems:    ?All systems reviewed and negative except where noted in HPI. ? ? Physical Exam:  ?LMP 12/04/2021 (Exact Date)  ?Patient's last menstrual period was 12/04/2021 (exact date). ?General:   Alert,  Well-developed, well-nourished, pleasant and cooperative in NAD ?Head:  Normocephalic and atraumatic. ?Eyes:  Sclera clear, no icterus.   Conjunctiva pink. ?Ears:  Normal auditory acuity. ?Neck:  Supple; no masses or thyromegaly. ?Lungs:  Respirations even and unlabored.  Clear throughout to auscultation.   No wheezes, crackles, or rhonchi. No acute distress. ?Heart:  Regular rate and rhythm; no murmurs, clicks, rubs, or gallops. ?Abdomen:  Normal bowel sounds.  No bruits.  Soft, positive tenderness diffusely and non-distended without masses, hepatosplenomegaly or hernias noted.  No guarding or rebound tenderness.  Negative Carnett sign.   ?Rectal:  Deferred.   ?Pulses:  Normal pulses noted. ?Extremities:  No clubbing or edema.  No cyanosis. ?Neurologic:  Alert and oriented x3;  grossly normal neurologically. ?Skin:  Intact without significant lesions or rashes.  No jaundice. ?Lymph Nodes:  No significant cervical adenopathy. ?Psych:  Alert and cooperative. Normal mood and affect. ? ?Imaging Studies: ?No results found. ? ?Assessment and Plan:  ? ?JANIENE AARONS is a 41 y.o. y/o female who comes in today with a change in bowel habits and weight loss of 65 pounds with early satiety and had seen Dr. Haig Prophet 2 weeks ago because that is her mother's doctor but was seen by her primary care provider who told her that she should see me.  The patient is a retired Engineer, structural.  Patient  will be set up for an EGD and colonoscopy due to early satiety, change in bowel habits and weight loss.  The patient has been told that this may represent functional bowel disorder but the weight loss is concerning.  Patient also reports that she has worse symptoms after she eats meat so we will have her blood sent off for alpha gal.  The patient has been explained the plan and agrees with it. ? ? ? ?Lucilla Lame, MD. Marval Regal ? ? ? Note: This dictation was prepared with Dragon dictation along with smaller phrase technology. Any transcriptional errors that result from this process are unintentional.   ?

## 2022-04-16 ENCOUNTER — Encounter: Payer: Self-pay | Admitting: Gastroenterology

## 2022-04-17 ENCOUNTER — Ambulatory Visit: Payer: Medicaid Other | Admitting: Gastroenterology

## 2022-04-18 ENCOUNTER — Ambulatory Visit: Payer: Self-pay | Admitting: Anesthesiology

## 2022-04-18 ENCOUNTER — Other Ambulatory Visit: Payer: Self-pay

## 2022-04-18 ENCOUNTER — Encounter
Admission: RE | Disposition: A | Discharge status: Home / Self Care | Payer: Self-pay | Source: Home / Self Care | Attending: Gastroenterology

## 2022-04-18 ENCOUNTER — Ambulatory Visit
Admission: RE | Admit: 2022-04-18 | Discharge: 2022-04-18 | Disposition: A | Discharge status: Home / Self Care | Payer: Self-pay | Attending: Gastroenterology | Admitting: Gastroenterology

## 2022-04-18 ENCOUNTER — Encounter: Payer: Self-pay | Admitting: Gastroenterology

## 2022-04-18 DIAGNOSIS — Z6820 Body mass index (BMI) 20.0-20.9, adult: Secondary | ICD-10-CM | POA: Insufficient documentation

## 2022-04-18 DIAGNOSIS — K2970 Gastritis, unspecified, without bleeding: Secondary | ICD-10-CM | POA: Insufficient documentation

## 2022-04-18 DIAGNOSIS — R194 Change in bowel habit: Secondary | ICD-10-CM

## 2022-04-18 DIAGNOSIS — Z87891 Personal history of nicotine dependence: Secondary | ICD-10-CM | POA: Insufficient documentation

## 2022-04-18 DIAGNOSIS — R634 Abnormal weight loss: Secondary | ICD-10-CM

## 2022-04-18 DIAGNOSIS — R197 Diarrhea, unspecified: Secondary | ICD-10-CM

## 2022-04-18 DIAGNOSIS — R109 Unspecified abdominal pain: Secondary | ICD-10-CM

## 2022-04-18 HISTORY — DX: Family history of other specified conditions: Z84.89

## 2022-04-18 HISTORY — DX: Nausea with vomiting, unspecified: R11.2

## 2022-04-18 HISTORY — PX: ESOPHAGOGASTRODUODENOSCOPY: SHX5428

## 2022-04-18 HISTORY — PX: COLONOSCOPY: SHX5424

## 2022-04-18 HISTORY — DX: Presence of external hearing-aid: Z97.4

## 2022-04-18 HISTORY — PX: BIOPSY: SHX5522

## 2022-04-18 HISTORY — DX: Other specified postprocedural states: Z98.890

## 2022-04-18 LAB — ALPHA-GAL PANEL
Allergen Lamb IgE: <0.1 kU/L
Beef IgE: <0.1 kU/L
IgE (Immunoglobulin E), Serum: 56 IU/mL (ref 6–495)
O215-IgE Alpha-Gal: <0.1 kU/L
Pork IgE: <0.1 kU/L

## 2022-04-18 SURGERY — COLONOSCOPY
Anesthesia: General | Site: Rectum

## 2022-04-18 MED ORDER — STERILE WATER FOR IRRIGATION IR SOLN: Status: DC | PRN

## 2022-04-18 MED ORDER — ACETAMINOPHEN 325 MG PO TABS: 325.0000 mg | ORAL_TABLET | Freq: Once | ORAL | Status: DC

## 2022-04-18 MED ORDER — PROPOFOL 10 MG/ML IV BOLUS
INTRAVENOUS | Status: DC | PRN
  Administered 2022-04-18 (×4): 20 mg via INTRAVENOUS
  Administered 2022-04-18: 30 mg via INTRAVENOUS
  Administered 2022-04-18: 100 mg via INTRAVENOUS
  Administered 2022-04-18 (×3): 20 mg via INTRAVENOUS

## 2022-04-18 MED ORDER — SODIUM CHLORIDE 0.9 % IV SOLN: INTRAVENOUS | Status: DC

## 2022-04-18 MED ORDER — LIDOCAINE HCL (CARDIAC) PF 100 MG/5ML IV SOSY
PREFILLED_SYRINGE | INTRAVENOUS | Status: DC | PRN
  Administered 2022-04-18: 50 mg via INTRAVENOUS

## 2022-04-18 MED ORDER — STERILE WATER FOR IRRIGATION IR SOLN
Status: DC | PRN
  Administered 2022-04-18: 1

## 2022-04-18 MED ORDER — ACETAMINOPHEN 160 MG/5ML PO SOLN: 325.0000 mg | Freq: Once | ORAL | Status: DC

## 2022-04-18 MED ORDER — LACTATED RINGERS IV SOLN: INTRAVENOUS | Status: DC

## 2022-04-18 MED ORDER — GLYCOPYRROLATE 0.2 MG/ML IJ SOLN
INTRAMUSCULAR | Status: DC | PRN
  Administered 2022-04-18: .1 mg via INTRAVENOUS

## 2022-04-18 SURGICAL SUPPLY — 38 items
BALLN DILATOR 10-12 8 (BALLOONS)
BALLN DILATOR 12-15 8 (BALLOONS)
BALLN DILATOR 15-18 8 (BALLOONS)
BALLN DILATOR CRE 0-12 8 (BALLOONS)
BALLN DILATOR ESOPH 8 10 CRE (MISCELLANEOUS) IMPLANT
BALLOON DILATOR 12-15 8 (BALLOONS) IMPLANT
BALLOON DILATOR 15-18 8 (BALLOONS) IMPLANT
BALLOON DILATOR CRE 0-12 8 (BALLOONS) IMPLANT
BLOCK BITE 60FR ADLT L/F GRN (MISCELLANEOUS) ×3 IMPLANT
CLIP HMST 235XBRD CATH ROT (MISCELLANEOUS) IMPLANT
CLIP RESOLUTION 360 11X235 (MISCELLANEOUS)
ELECT REM PT RETURN 9FT ADLT (ELECTROSURGICAL)
ELECTRODE REM PT RTRN 9FT ADLT (ELECTROSURGICAL) IMPLANT
FCP ESCP3.2XJMB 240X2.8X (MISCELLANEOUS)
FORCEPS BIOP RAD 4 LRG CAP 4 (CUTTING FORCEPS) ×2 IMPLANT
FORCEPS BIOP RJ4 240 W/NDL (MISCELLANEOUS)
FORCEPS ESCP3.2XJMB 240X2.8X (MISCELLANEOUS) IMPLANT
GOWN CVR UNV OPN BCK APRN NK (MISCELLANEOUS) ×8 IMPLANT
GOWN ISOL THUMB LOOP REG UNIV (MISCELLANEOUS) ×12
INJECTOR VARIJECT VIN23 (MISCELLANEOUS) IMPLANT
KIT DEFENDO VALVE AND CONN (KITS) IMPLANT
KIT PRC NS LF DISP ENDO (KITS) ×4 IMPLANT
KIT PROCEDURE OLYMPUS (KITS) ×6
MANIFOLD NEPTUNE II (INSTRUMENTS) ×6 IMPLANT
MARKER SPOT ENDO TATTOO 5ML (MISCELLANEOUS) IMPLANT
PROBE APC STR FIRE (PROBE) IMPLANT
RETRIEVER NET PLAT FOOD (MISCELLANEOUS) IMPLANT
RETRIEVER NET ROTH 2.5X230 LF (MISCELLANEOUS) IMPLANT
SNARE COLD EXACTO (MISCELLANEOUS) IMPLANT
SNARE SHORT THROW 13M SML OVAL (MISCELLANEOUS) IMPLANT
SNARE SHORT THROW 30M LRG OVAL (MISCELLANEOUS) IMPLANT
SNARE SNG USE RND 15MM (INSTRUMENTS) IMPLANT
SPOT EX ENDOSCOPIC TATTOO (MISCELLANEOUS)
SYR INFLATION 60ML (SYRINGE) IMPLANT
TRAP ETRAP POLY (MISCELLANEOUS) IMPLANT
VARIJECT INJECTOR VIN23 (MISCELLANEOUS)
WATER STERILE IRR 250ML POUR (IV SOLUTION) ×6 IMPLANT
WIRE CRE 18-20MM 8CM F G (MISCELLANEOUS) IMPLANT

## 2022-04-18 NOTE — Transfer of Care (Signed)
Immediate Anesthesia Transfer of Care Note ? ?Patient: Elizabeth Peters ? ?Procedure(s) Performed: COLONOSCOPY (Rectum) ?BIOPSY (Rectum) ?ESOPHAGOGASTRODUODENOSCOPY (EGD) (Esophagus) ? ?Patient Location: PACU ? ?Anesthesia Type: General ? ?Level of Consciousness: awake, alert  and patient cooperative ? ?Airway and Oxygen Therapy: Patient Spontanous Breathing and Patient connected to supplemental oxygen ? ?Post-op Assessment: Post-op Vital signs reviewed, Patient's Cardiovascular Status Stable, Respiratory Function Stable, Patent Airway and No signs of Nausea or vomiting ? ?Post-op Vital Signs: Reviewed and stable ? ?Complications: No notable events documented. ? ?

## 2022-04-18 NOTE — Interval H&P Note (Signed)
? ?Elizabeth Lame, MD [facility] ?[address] ?[place], [place] [address] ?Phone:[phone] ?Fax : [phone] ? ?Primary Care Physician:  Elizabeth Bond, NP ?Primary Gastroenterologist:  Dr. Allen Peters ? ?Pre-Procedure History & Physical: ?HPI:  Elizabeth Peters is a 41 y.o. female is here for an endoscopy and colonoscopy. ?  ?Past Medical History:  ?Diagnosis Date  ? Anxiety   ? Brain tumor (benign) ([place]) 2011  ? Chronic pain due to injury   ? spine  ? Endometriosis   ? Family history of adverse reaction to anesthesia   ? Mother and sister - PONV  ? PONV (postoperative nausea and vomiting)   ? after hysterectomy  ? Wears hearing aid in left ear   ? ? ?Past Surgical History:  ?Procedure Laterality Date  ? BRAIN SURGERY  2011  ? tumor  ? CYSTOSCOPY N/A 01/03/2022  ? Procedure: CYSTOSCOPY;  Surgeon: Elizabeth Dry, MD;  Location: [facility] ORS;  Service: Gynecology;  Laterality: N/A;  ? [place] OF UTERUS  2007  ? neck fusion    ? C6-C7 fusion  ? SHOULDER SURGERY Left 2011  ? SPINE SURGERY  2013  ? neck fulsion  ? TONSILLECTOMY  2008  ? TOTAL LAPAROSCOPIC HYSTERECTOMY WITH BILATERAL SALPINGO OOPHORECTOMY N/A 01/03/2022  ? Procedure: TOTAL LAPAROSCOPIC HYSTERECTOMY WITH BILATERAL SALPINGECTOMY, LEFT OOPHORECTOMY;  Surgeon: Elizabeth Dry, MD;  Location: [facility] ORS;  Service: Gynecology;  Laterality: N/A;  ? ? ?Prior to Admission medications   ?Not on File  ? ? ?Allergies as of 04/15/2022 - Review Complete 04/15/2022  ?Allergen Reaction Noted  ? Aspirin Anaphylaxis 06/07/2015  ? Penicillins Anaphylaxis 06/07/2015  ? ? ?Family History  ?Problem Relation Age of Onset  ? Diabetes Mother   ? Cancer Mother   ? Breast cancer Mother 40  ? Cancer Father   ? Skin cancer Father   ? Heart attack Father   ? Stroke Paternal Grandmother   ? Diabetes Paternal Grandmother   ? Cancer Paternal Grandmother   ? Colon cancer Paternal Grandmother   ? Cancer Paternal Grandfather   ? Lung cancer Paternal Grandfather   ? Breast cancer  Cousin   ?     maternal side early 9's  ? ? ?Social History  ? ?Socioeconomic History  ? Marital status: Legally Separated  ?  Spouse name: Not on file  ? Number of children: Not on file  ? Years of education: Not on file  ? Highest education level: Not on file  ?Occupational History  ? Not on file  ?Tobacco Use  ? Smoking status: Former  ?  Years: 4.00  ?  Types: Cigarettes  ?  Quit date: 12/16/2012  ?  Years since quitting: 9.3  ? Smokeless tobacco: Never  ?Vaping Use  ? Vaping Use: Never used  ?Substance and Sexual Activity  ? Alcohol use: No  ?  Alcohol/week: 0.0 standard drinks  ? Drug use: No  ? Sexual activity: Not Currently  ?Other Topics Concern  ? Not on file  ?Social History Narrative  ? Not on file  ? ?Social Determinants of Health  ? ?Financial Resource Strain: Not on file  ?Food Insecurity: Not on file  ?Transportation Needs: Not on file  ?Physical Activity: Not on file  ?Stress: Not on file  ?Social Connections: Not on file  ?Intimate Partner Violence: Not on file  ? ? ?Review of Systems: ?See HPI, otherwise negative ROS ? ?Physical Exam: ?BP 133/81   Pulse (!) 59  Temp 99 ?F (37.2 ?C) (Temporal)   Resp 16   Ht '5\' 5"'$  (1.651 m)   Wt 59.4 kg   LMP 12/04/2021 (Exact Date)   SpO2 100%   BMI 21.80 kg/m?  ?General:   Alert,  pleasant and cooperative in NAD ?Head:  Normocephalic and atraumatic. ?Neck:  Supple; no masses or thyromegaly. ?Lungs:  Clear throughout to auscultation.    ?Heart:  Regular rate and rhythm. ?Abdomen:  Soft, nontender and nondistended. Normal bowel sounds, without guarding, and without rebound.   ?Neurologic:  Alert and  oriented x4;  grossly normal neurologically. ? ?Impression/Plan: ?Elizabeth Peters is here for an endoscopy and colonoscopy to be performed for EGD and colonoscopy due to early satiety, change in bowel habits and weight loss ? ?Risks, benefits, limitations, and alternatives regarding  endoscopy and colonoscopy have been reviewed with the patient.  Questions have  been answered.  All parties agreeable. ? ? ?Elizabeth Lame, MD  04/18/2022, 10:39 AM ?

## 2022-04-18 NOTE — Anesthesia Preprocedure Evaluation (Signed)
Anesthesia Evaluation  ?Patient identified by MRN, date of birth, ID band ?Patient awake ? ? ? ?Reviewed: ?Allergy & Precautions, H&P , NPO status , Patient's Chart, lab work & pertinent test results ? ?History of Anesthesia Complications ?(+) PONV and history of anesthetic complications ? ?Airway ?Mallampati: II ? ?TM Distance: >3 FB ?Neck ROM: full ? ? ? Dental ?no notable dental hx. ? ?  ?Pulmonary ?former smoker,  ?  ?Pulmonary exam normal ?breath sounds clear to auscultation ? ? ? ? ? ? Cardiovascular ?Normal cardiovascular exam ?Rhythm:regular Rate:Normal ? ? ?  ?Neuro/Psych ?  ? GI/Hepatic ?  ?Endo/Other  ? ? Renal/GU ?  ? ?  ?Musculoskeletal ? ? Abdominal ?  ?Peds ? Hematology ?  ?Anesthesia Other Findings ? ? Reproductive/Obstetrics ? ?  ? ? ? ? ? ? ? ? ? ? ? ? ? ?  ?  ? ? ? ? ? ? ? ? ?Anesthesia Physical ?Anesthesia Plan ? ?ASA: 2 ? ?Anesthesia Plan: General  ? ?Post-op Pain Management: Minimal or no pain anticipated  ? ?Induction: Intravenous ? ?PONV Risk Score and Plan: 4 or greater and Treatment may vary due to age or medical condition, TIVA and Propofol infusion ? ?Airway Management Planned: Natural Airway ? ?Additional Equipment:  ? ?Intra-op Plan:  ? ?Post-operative Plan:  ? ?Informed Consent: I have reviewed the patients History and Physical, chart, labs and discussed the procedure including the risks, benefits and alternatives for the proposed anesthesia with the patient or authorized representative who has indicated his/her understanding and acceptance.  ? ? ? ?Dental Advisory Given ? ?Plan Discussed with: CRNA ? ?Anesthesia Plan Comments:   ? ? ? ? ? ? ?Anesthesia Quick Evaluation ? ?

## 2022-04-18 NOTE — Anesthesia Procedure Notes (Signed)
Date/Time: 04/18/2022 11:17 AM ?Performed by: Mayme Genta, CRNA ?Pre-anesthesia Checklist: Patient identified, Emergency Drugs available, Suction available, Timeout performed and Patient being monitored ?Patient Re-evaluated:Patient Re-evaluated prior to induction ?Oxygen Delivery Method: Nasal cannula ?Placement Confirmation: positive ETCO2 ? ? ? ? ?

## 2022-04-18 NOTE — Anesthesia Postprocedure Evaluation (Signed)
Anesthesia Post Note ? ?Patient: SHELVIA FOJTIK ? ?Procedure(s) Performed: COLONOSCOPY (Rectum) ?BIOPSY (Rectum) ?ESOPHAGOGASTRODUODENOSCOPY (EGD) (Esophagus) ? ? ?  ?Patient location during evaluation: PACU ?Anesthesia Type: General ?Level of consciousness: awake and alert and oriented ?Pain management: satisfactory to patient ?Vital Signs Assessment: post-procedure vital signs reviewed and stable ?Respiratory status: spontaneous breathing, nonlabored ventilation and respiratory function stable ?Cardiovascular status: blood pressure returned to baseline and stable ?Postop Assessment: Adequate PO intake and No signs of nausea or vomiting ?Anesthetic complications: no ? ? ?No notable events documented. ? ?Raliegh Ip ? ? ? ? ? ?

## 2022-04-18 NOTE — Op Note (Signed)
[facility] ?Gastroenterology ?Patient Name: Elizabeth Peters ?Procedure Date: 04/18/2022 11:06 AM ?MRN: [id number] ?Account #: [id number] ?Date of Birth: 02/11/1981 ?Admit Type: Outpatient ?Age: 41 ?Room: [facility] OR ROOM 01 ?Gender: Female ?Note Status: Finalized ?Instrument Name: [id number] ?Procedure:             Colonoscopy ?Indications:           Change in bowel habits, Weight loss ?Providers:             Lucilla Lame MD, MD ?Medicines:             Propofol per Anesthesia ?Complications:         No immediate complications. ?Procedure:             Pre-Anesthesia Assessment: ?                       - Prior to the procedure, a History and Physical was  ?                       performed, and patient medications and allergies were  ?                       reviewed. The patient's tolerance of previous  ?                       anesthesia was also reviewed. The risks and benefits  ?                       of the procedure and the sedation options and risks  ?                       were discussed with the patient. All questions were  ?                       answered, and informed consent was obtained. Prior  ?                       Anticoagulants: The patient has taken no previous  ?                       anticoagulant or antiplatelet agents. ASA Grade  ?                       Assessment: II - A patient with mild systemic disease.  ?                       After reviewing the risks and benefits, the patient  ?                       was deemed in satisfactory condition to undergo the  ?                       procedure. ?                       After obtaining informed consent, the colonoscope was  ?                       passed under direct vision. Throughout the procedure,  ?  the patient's blood pressure, pulse, and oxygen  ?                       saturations were monitored continuously. The  ?                       Colonoscope was introduced through the anus and  ?                       advanced  to the the terminal ileum. The colonoscopy  ?                       was performed without difficulty. The patient  ?                       tolerated the procedure well. The quality of the bowel  ?                       preparation was excellent. ?Findings: ?     The perianal and digital rectal examinations were normal. ?     The terminal ileum appeared normal. Biopsies were taken with a cold  ?     forceps for histology. ?     The colon (entire examined portion) appeared normal. Multiple random  ?     biopsies were obtained in the entire colon with cold forceps for  ?     histology. ?Impression:            - The examined portion of the ileum was normal.  ?                       Biopsied. ?                       - The entire examined colon is normal. ?                       - Multiple random biopsies were obtained in the entire  ?                       colon. ?Recommendation:        - Discharge patient to home. ?                       - Resume previous diet. ?                       - Continue present medications. ?                       - Await pathology results. ?Procedure Code(s):     --- Professional --- ?                       [id number], Colonoscopy, flexible; with biopsy, single or  ?                       multiple ?Diagnosis Code(s):     --- Professional --- ?                       R63.4, Abnormal weight loss ?  R19.4, Change in bowel habit ?CPT copyright 2019 American Medical Association. All rights reserved. ?The codes documented in this report are preliminary and upon coder review may  ?be revised to meet current compliance requirements. ?Lucilla Lame MD, MD ?04/18/2022 11:37:02 AM ?This report has been signed electronically. ?Number of Addenda: 0 ?Note Initiated On: 04/18/2022 11:06 AM ?Scope Withdrawal Time: 0 hours 6 minutes 52 seconds  ?Total Procedure Duration: 0 hours 8 minutes 18 seconds  ?Estimated Blood Loss:  Estimated blood loss: none. ?     [facility] ?

## 2022-04-18 NOTE — Op Note (Signed)
[facility] ?Gastroenterology ?Patient Name: Elizabeth Peters ?Procedure Date: 04/18/2022 11:11 AM ?MRN: [id number] ?Account #: [id number] ?Date of Birth: Oct 26, 1981 ?Admit Type: Outpatient ?Age: 41 ?Room: [facility] OR ROOM 1 ?Gender: Female ?Note Status: Finalized ?Instrument Name: [id number] ?Procedure:             Upper GI endoscopy ?Indications:           Generalized abdominal pain, Weight loss ?Providers:             Lucilla Lame MD, MD ?Medicines:             Propofol per Anesthesia ?Complications:         No immediate complications. ?Procedure:             Pre-Anesthesia Assessment: ?                       - Prior to the procedure, a History and Physical was  ?                       performed, and patient medications and allergies were  ?                       reviewed. The patient's tolerance of previous  ?                       anesthesia was also reviewed. The risks and benefits  ?                       of the procedure and the sedation options and risks  ?                       were discussed with the patient. All questions were  ?                       answered, and informed consent was obtained. Prior  ?                       Anticoagulants: The patient has taken no previous  ?                       anticoagulant or antiplatelet agents. ASA Grade  ?                       Assessment: II - A patient with mild systemic disease.  ?                       After reviewing the risks and benefits, the patient  ?                       was deemed in satisfactory condition to undergo the  ?                       procedure. ?                       After obtaining informed consent, the endoscope was  ?                       passed under direct vision. Throughout the procedure,  ?  the patient's blood pressure, pulse, and oxygen  ?                       saturations were monitored continuously. The Endoscope  ?                       was introduced through the mouth, and advanced to the  ?                        second part of duodenum. The upper GI endoscopy was  ?                       accomplished without difficulty. The patient tolerated  ?                       the procedure well. ?Findings: ?     The examined esophagus was normal. ?     The entire examined stomach was normal. Biopsies were taken with a cold  ?     forceps for histology. ?     The examined duodenum was normal. Biopsies were taken with a cold  ?     forceps for histology. ?Impression:            - Normal esophagus. ?                       - Normal stomach. Biopsied. ?                       - Normal examined duodenum. Biopsied. ?Recommendation:        - Discharge patient to home. ?                       - Resume previous diet. ?                       - Continue present medications. ?                       - Await pathology results. ?                       - Perform a colonoscopy today. ?Procedure Code(s):     --- Professional --- ?                       [phone], Esophagogastroduodenoscopy, flexible,  ?                       transoral; with biopsy, single or multiple ?Diagnosis Code(s):     --- Professional --- ?                       R63.4, Abnormal weight loss ?CPT copyright 2019 American Medical Association. All rights reserved. ?The codes documented in this report are preliminary and upon coder review may  ?be revised to meet current compliance requirements. ?Lucilla Lame MD, MD ?04/18/2022 11:24:44 AM ?This report has been signed electronically. ?Number of Addenda: 0 ?Note Initiated On: 04/18/2022 11:11 AM ?Total Procedure Duration: 0 hours 2 minutes 30 seconds  ?Estimated Blood Loss:  Estimated blood loss: none. ?     [facility] ?

## 2022-04-19 LAB — SURGICAL PATHOLOGY

## 2022-04-22 ENCOUNTER — Encounter: Payer: Self-pay | Admitting: Gastroenterology

## 2022-04-29 ENCOUNTER — Ambulatory Visit (INDEPENDENT_AMBULATORY_CARE_PROVIDER_SITE_OTHER): Payer: Self-pay

## 2022-04-29 ENCOUNTER — Ambulatory Visit
Admission: EM | Admit: 2022-04-29 | Discharge: 2022-04-29 | Disposition: A | Discharge status: Home / Self Care | Payer: Medicaid Other | Attending: Emergency Medicine | Admitting: Emergency Medicine

## 2022-04-29 ENCOUNTER — Encounter: Payer: Self-pay | Admitting: Emergency Medicine

## 2022-04-29 DIAGNOSIS — M25531 Pain in right wrist: Secondary | ICD-10-CM

## 2022-04-29 NOTE — Discharge Instructions (Addendum)
Your x-ray today did not show injury to the bone of your wrist or hand. Your pain is most likely being caused by irritation to the soft tissues, this should improve as time progresses.  ? ?For the next 3 to 4 days use anti-inflammatory medicine such as ibuprofen or naproxen to help reduce any swelling and reduce your pain ? ?You may apply heat or ice, whichever makes you feel better, to affected area in 15 minute intervals ? ?You may continue activity as tolerated, there is no injury therefore, it is important that you continue to move around so you do not loose strength to the area ? ? you may wrap ankle with ace wrap for additional support while completing activities, once wrapped if you begin to experience numbness or tingling it is too tight, remove and redo, you should be able to easily fit one finger under wrap  ? ?If symptoms persist past 2 weeks, you may follow up at urgent care or with orthopedic specialist for evaluation, an orthopedic doctor specializes in the bone, they may provide  management such as but not limited to imaging, long term medications and physical therapy ? ?

## 2022-04-29 NOTE — ED Provider Notes (Signed)
?[facility] ? ? ? ?CSN: [id number] ?Arrival date & time: 04/29/22  0831 ? ? ?  ? ?History   ?Chief Complaint ?Chief Complaint  ?Patient presents with  ? Wrist Pain  ? ? ?HPI ?Elizabeth Peters is a 41 y.o. female.  ? ?Patient presents with right wrist and hand pain for 2 days.  Endorses that she was using a machine to cut flowers when she forcibly slammed her hand onto the top of the machine.  Range of motion is intact.  Endorses a electric shooting pain into the second third and fourth finger.  Has attempted use of ice and elevation which has been somewhat helpful. history of a boxer's fracture.  ? ?Past Medical History:  ?Diagnosis Date  ? Anxiety   ? Brain tumor (benign) ([place]) 2011  ? Chronic pain due to injury   ? spine  ? Endometriosis   ? Family history of adverse reaction to anesthesia   ? Mother and sister - PONV  ? PONV (postoperative nausea and vomiting)   ? after hysterectomy  ? Wears hearing aid in left ear   ? ? ?Patient Active Problem List  ? Diagnosis Date Noted  ? Loss of weight   ? Change in bowel habits   ? Diarrhea   ? Pelvic pain 10/08/2021  ? Endometriosis 10/08/2021  ? Irregular menses 10/08/2021  ? Chronic headaches 10/11/2020  ? DDD (degenerative disc disease), cervical 10/11/2020  ? Insomnia 10/11/2020  ? Enthesopathy of right hip 03/07/2020  ? Axillary pain 06/08/2015  ? Discharge from left nipple 06/08/2015  ? Mastalgia 06/08/2015  ? ? ?Past Surgical History:  ?Procedure Laterality Date  ? BIOPSY N/A 04/18/2022  ? Procedure: BIOPSY;  Surgeon: Lucilla Lame, MD;  Location: [place];  Service: Endoscopy;  Laterality: N/A;  ? BRAIN SURGERY  2011  ? tumor  ? COLONOSCOPY N/A 04/18/2022  ? Procedure: COLONOSCOPY;  Surgeon: Lucilla Lame, MD;  Location: [place];  Service: Endoscopy;  Laterality: N/A;  ? CYSTOSCOPY N/A 01/03/2022  ? Procedure: CYSTOSCOPY;  Surgeon: Gae Dry, MD;  Location: [facility];  Service: Gynecology;  Laterality: N/A;  ? [place]  OF UTERUS  2007  ? ESOPHAGOGASTRODUODENOSCOPY N/A 04/18/2022  ? Procedure: ESOPHAGOGASTRODUODENOSCOPY (EGD);  Surgeon: Lucilla Lame, MD;  Location: [place];  Service: Endoscopy;  Laterality: N/A;  ? neck fusion    ? C6-C7 fusion  ? SHOULDER SURGERY Left 2011  ? SPINE SURGERY  2013  ? neck fulsion  ? TONSILLECTOMY  2008  ? TOTAL LAPAROSCOPIC HYSTERECTOMY WITH BILATERAL SALPINGO OOPHORECTOMY N/A 01/03/2022  ? Procedure: TOTAL LAPAROSCOPIC HYSTERECTOMY WITH BILATERAL SALPINGECTOMY, LEFT OOPHORECTOMY;  Surgeon: Gae Dry, MD;  Location: [facility];  Service: Gynecology;  Laterality: N/A;  ? ? ?OB History   ?No obstetric history on file. ?  ? ? ? ?Home Medications   ? ?Prior to Admission medications   ?Not on File  ? ? ?Family History ?Family History  ?Problem Relation Age of Onset  ? Diabetes Mother   ? Cancer Mother   ? Breast cancer Mother 73  ? Cancer Father   ? Skin cancer Father   ? Heart attack Father   ? Stroke Paternal Grandmother   ? Diabetes Paternal Grandmother   ? Cancer Paternal Grandmother   ? Colon cancer Paternal Grandmother   ? Cancer Paternal Grandfather   ? Lung cancer Paternal Grandfather   ? Breast cancer Cousin   ?  maternal side early 64's  ? ? ?Social History ?Social History  ? ?Tobacco Use  ? Smoking status: Former  ?  Years: 4.00  ?  Types: Cigarettes  ?  Quit date: 12/16/2012  ?  Years since quitting: 9.3  ? Smokeless tobacco: Never  ?Vaping Use  ? Vaping Use: Never used  ?Substance Use Topics  ? Alcohol use: No  ?  Alcohol/week: 0.0 standard drinks  ? Drug use: No  ? ? ? ?Allergies   ?Aspirin and Penicillins ? ? ?Review of Systems ?Review of Systems  ?Constitutional: Negative.   ?Respiratory: Negative.    ?Cardiovascular: Negative.   ?Musculoskeletal:  Positive for myalgias. Negative for arthralgias, back pain, gait problem, joint swelling, neck pain and neck stiffness.  ?Skin: Negative.   ? ? ?Physical Exam ?Triage Vital Signs ?ED Triage Vitals  ?Enc Vitals Group  ?   BP  04/29/22 0935 (!) 121/94  ?   Pulse Rate 04/29/22 0935 83  ?   Resp 04/29/22 0935 16  ?   Temp 04/29/22 0935 98.1 ?F (36.7 ?C)  ?   Temp Source 04/29/22 0935 Oral  ?   SpO2 04/29/22 0935 100 %  ?   Weight 04/29/22 0934 131 lb (59.4 kg)  ?   Height 04/29/22 0934 '5\' 5"'$  (1.651 m)  ?   Head Circumference --   ?   Peak Flow --   ?   Pain Score 04/29/22 0933 4  ?   Pain Loc --   ?   Pain Edu? --   ?   Excl. in [place]? --   ? ?No data found. ? ?Updated Vital Signs ?BP (!) 121/94 (BP Location: Left Arm)   Pulse 83   Temp 98.1 ?F (36.7 ?C) (Oral)   Resp 16   Ht '5\' 5"'$  (1.651 m)   Wt 131 lb (59.4 kg)   LMP 12/04/2021 (Exact Date)   SpO2 100%   BMI 21.80 kg/m?  ? ?Visual Acuity ?Right Eye Distance:   ?Left Eye Distance:   ?Bilateral Distance:   ? ?Right Eye Near:   ?Left Eye Near:    ?Bilateral Near:    ? ?Physical Exam ?Constitutional:   ?   Appearance: Normal appearance.  ?HENT:  ?   Head: Normocephalic.  ?Eyes:  ?   Extraocular Movements: Extraocular movements intact.  ?Pulmonary:  ?   Effort: Pulmonary effort is normal.  ?Musculoskeletal:  ?   Comments: Tenderness along the volar aspect of the right wrist and on the palmar aspect of the right hand with point tenderness along the ulnar side, sensation intact, 2+ radial pulse, capillary refills less than 3, range of motion intact  ?Skin: ?   General: Skin is warm and dry.  ?Neurological:  ?   Mental Status: She is alert and oriented to person, place, and time. Mental status is at baseline.  ?Psychiatric:     ?   Mood and Affect: Mood normal.     ?   Behavior: Behavior normal.  ? ? ? ?UC Treatments / Results  ?Labs ?(all labs ordered are listed, but only abnormal results are displayed) ?Labs Reviewed - No data to display ? ?EKG ? ? ?Radiology ?DG Wrist Complete Right ? ?Result Date: 04/29/2022 ?CLINICAL DATA:  Right wrist pain after hitting a garden tool. EXAM: RIGHT WRIST - COMPLETE 3+ VIEW; RIGHT HAND - COMPLETE 3+ VIEW COMPARISON:  Right forearm radiographs 04/08/2018,  hand radiographs 04/08/2018 FINDINGS: Wrist: There is no acute fracture  or dislocation. Alignment is normal. The joint spaces are preserved. There is no erosive change. The soft tissues are unremarkable. Hand: There is no acute fracture or dislocation. Alignment is normal. The joint spaces are preserved. There is no erosive change. The soft tissues are unremarkable. IMPRESSION: No acute fracture or dislocation in the wrist or hand. Electronically Signed   By: Valetta Mole M.D.   On: 04/29/2022 10:19  ? ?DG Hand Complete Right ? ?Result Date: 04/29/2022 ?CLINICAL DATA:  Right wrist pain after hitting a garden tool. EXAM: RIGHT WRIST - COMPLETE 3+ VIEW; RIGHT HAND - COMPLETE 3+ VIEW COMPARISON:  Right forearm radiographs 04/08/2018, hand radiographs 04/08/2018 FINDINGS: Wrist: There is no acute fracture or dislocation. Alignment is normal. The joint spaces are preserved. There is no erosive change. The soft tissues are unremarkable. Hand: There is no acute fracture or dislocation. Alignment is normal. The joint spaces are preserved. There is no erosive change. The soft tissues are unremarkable. IMPRESSION: No acute fracture or dislocation in the wrist or hand. Electronically Signed   By: Valetta Mole M.D.   On: 04/29/2022 10:19   ? ?Procedures ?Procedures (including critical care time) ? ?Medications Ordered in UC ?Medications - No data to display ? ?Initial Impression / Assessment and Plan / UC Course  ?I have reviewed the triage vital signs and the nursing notes. ? ?Pertinent labs & imaging results that were available during my care of the patient were reviewed by me and considered in my medical decision making (see chart for details). ? ?Acute pain of the right wrist ? ?Right hand and right wrist x-ray negative, discussed findings with patient, recommended consistent use of NSAID to help reduce any inflammation as well as for discomfort, recommended ice or heat over the affected area, rest, elevation and compression  as needed, given walking referral to orthopedics if symptoms still present in 2 weeks ?Final Clinical Impressions(s) / UC Diagnoses  ? ?Final diagnoses:  ?Acute pain of right wrist  ? ? ? ?Discharge Instructions

## 2022-04-29 NOTE — ED Triage Notes (Signed)
Pt reports right wrist pain after using a machine to cut flowers on Saturday. States she hit the handle with force and has had wrist pain since. ?

## 2022-05-15 ENCOUNTER — Encounter: Payer: Self-pay | Admitting: Family Medicine

## 2022-05-15 ENCOUNTER — Ambulatory Visit (INDEPENDENT_AMBULATORY_CARE_PROVIDER_SITE_OTHER): Payer: Self-pay | Admitting: Family Medicine

## 2022-05-15 VITALS — BP 127/66 | HR 60 | Temp 98.5°F | Ht 64.1 in | Wt 137.6 lb

## 2022-05-15 DIAGNOSIS — R634 Abnormal weight loss: Secondary | ICD-10-CM

## 2022-05-15 DIAGNOSIS — R413 Other amnesia: Secondary | ICD-10-CM

## 2022-05-15 DIAGNOSIS — R41840 Attention and concentration deficit: Secondary | ICD-10-CM

## 2022-05-15 DIAGNOSIS — Z87898 Personal history of other specified conditions: Secondary | ICD-10-CM

## 2022-05-15 DIAGNOSIS — Z8659 Personal history of other mental and behavioral disorders: Secondary | ICD-10-CM

## 2022-05-15 DIAGNOSIS — Z818 Family history of other mental and behavioral disorders: Secondary | ICD-10-CM

## 2022-05-15 DIAGNOSIS — G8929 Other chronic pain: Secondary | ICD-10-CM

## 2022-05-15 DIAGNOSIS — Z1231 Encounter for screening mammogram for malignant neoplasm of breast: Secondary | ICD-10-CM

## 2022-05-15 DIAGNOSIS — Z13220 Encounter for screening for lipoid disorders: Secondary | ICD-10-CM

## 2022-05-15 DIAGNOSIS — R519 Headache, unspecified: Secondary | ICD-10-CM

## 2022-05-15 MED ORDER — DULOXETINE HCL 20 MG PO CPEP
ORAL_CAPSULE | ORAL | 3 refills | Status: DC
Start: 2022-05-15 — End: 2022-07-16

## 2022-05-15 NOTE — Progress Notes (Unsigned)
BP 127/66   Pulse 60   Temp 98.5 F (36.9 C) (Oral)   Ht 5' 4.1" (1.628 m)   Wt 137 lb 9.6 oz (62.4 kg)   LMP 12/04/2021 (Exact Date)   SpO2 99%   BMI 23.55 kg/m    Subjective:    Patient ID: Betti Cruz, female    DOB: [birth year], 41 y.o.   MRN: [id number]  HPI: IVANNIA WILLHELM is a 41 y.o. female who presents today to establish care.   Chief Complaint  Patient presents with   Establish Care   Memory Loss    Pt states she has been having trouble with her memory for the last few months, states it has gotten worse in the last month. States she has trouble with day to day things.    Rosezella notes that something has been very wrong for the past few months. She has been having issues with her memory for about the past 6+ months much much worse in the past 1-2 months. She can't have a conversation because she feels like she can't follow a conversation. Can't retain information. Nothing is passing from short term to long term memory. Has been driving to wrong places. Getting lost. Has been zoning out and not paying attention. She is having word finding difficulty. Has been forgetting things. Ask the same questions. Has been forgetting people who she knows, but if she doesn't see them daily she doesn't know who they are.   Has a strong family history of dementia and has been concerned about that. She is planning on getting an MRI through her previous PCP but it has not been scheduled. She had a brain tumor in 2011. About 6 months after she went back to work and she had an accident on the job where she hit her head. She was fine after that for more than 10 years until about 6 months ago. She also notes that loud noises and repetative noises make her very angry. She has been very anxious recently. She was on antidepressants, but doesn't feel like they were helping so she stopped them. She has a rx for buspar, but has not tried it yet.   She also notes that she has not eaten a full meal in a  year. She weighed 210lbs and has lost down to 137, this appears to have been over a bit of time as our records show weights only up to 174lbs going back to 2018. She has had a colonoscopy which was normal and she is up to date on her screening tests except her mammogram.  DEPRESSION Mood status: controlled Satisfied with current treatment?: yes Symptom severity: mild  Duration of current treatment :  has been off meds because she is feeling good Side effects: yes- felt numb Medication compliance: not on anything Previous psychiatric medications: abilify, buspar, prozac, and zoloft Depressed mood: no Anxious mood: yes Anhedonia: no Significant weight loss or gain: yes Insomnia: no  Fatigue: yes Feelings of worthlessness or guilt: no Impaired concentration/indecisiveness: yes Suicidal ideations: no Hopelessness: no Crying spells: no    05/15/2022    2:40 PM 10/04/2021    2:33 PM  Depression screen PHQ 2/9  Decreased Interest 1 2  Down, Depressed, Hopeless 0 1  PHQ - 2 Score 1 3  Altered sleeping 3 2  Tired, decreased energy 0 1  Change in appetite 3 3  Feeling bad or failure about yourself  0 0  Trouble concentrating 3 1  Moving  slowly or fidgety/restless 3 3  Suicidal thoughts 0 0  PHQ-9 Score 13 13  Difficult doing work/chores Somewhat difficult Not difficult at all      05/15/2022    2:41 PM 10/04/2021    2:34 PM  GAD 7 : Generalized Anxiety Score  Nervous, Anxious, on Edge 2 3  Control/stop worrying 2 0  Worry too much - different things 2 0  Trouble relaxing 2 1  Restless 3 0  Easily annoyed or irritable 3 3  Afraid - awful might happen 0 0  Total GAD 7 Score 14 7  Anxiety Difficulty Somewhat difficult Not difficult at all      05/15/2022    3:12 PM  MMSE - Mini Mental State Exam  Orientation to time 4  Orientation to Place 5  Registration 3  Attention/ Calculation 5  Recall 1  Language- name 2 objects 2  Language- repeat 1  Language- follow 3 step  command 3  Language- read & follow direction 1  Write a sentence 1  Copy design 1  Total score 27    Active Ambulatory Problems    Diagnosis Date Noted   Enthesopathy of right hip 03/07/2020   Chronic headaches 10/11/2020   DDD (degenerative disc disease), cervical 10/11/2020   Insomnia 10/11/2020   Endometriosis 10/08/2021   Weight loss    History of depression 05/15/2022   History of brain tumor 05/16/2022   Memory loss 05/16/2022   Resolved Ambulatory Problems    Diagnosis Date Noted   Axillary pain 06/08/2015   Discharge from left nipple 06/08/2015   Mastalgia 06/08/2015   Pelvic pain 10/08/2021   Irregular menses 10/08/2021   Change in bowel habits    Diarrhea    Past Medical History:  Diagnosis Date   Anxiety    Brain tumor (benign) ([place]) 2011   Chronic pain due to injury    Family history of adverse reaction to anesthesia    PONV (postoperative nausea and vomiting)    Wears hearing aid in left ear    Past Surgical History:  Procedure Laterality Date   BIOPSY N/A 04/18/2022   Procedure: BIOPSY;  Surgeon: Lucilla Lame, MD;  Location: [facility];  Service: Endoscopy;  Laterality: N/A;   BRAIN SURGERY  2011   tumor   COLONOSCOPY N/A 04/18/2022   Procedure: COLONOSCOPY;  Surgeon: Lucilla Lame, MD;  Location: [facility];  Service: Endoscopy;  Laterality: N/A;   CYSTOSCOPY N/A 01/03/2022   Procedure: CYSTOSCOPY;  Surgeon: Gae Dry, MD;  Location: [facility];  Service: Gynecology;  Laterality: N/A;   DILATION AND CURETTAGE OF UTERUS  2007   ESOPHAGOGASTRODUODENOSCOPY N/A 04/18/2022   Procedure: ESOPHAGOGASTRODUODENOSCOPY (EGD);  Surgeon: Lucilla Lame, MD;  Location: [facility];  Service: Endoscopy;  Laterality: N/A;   neck fusion     C6-C7 fusion   SHOULDER SURGERY Left 2011   SPINE SURGERY  2013   neck fulsion   TONSILLECTOMY  2008   TOTAL LAPAROSCOPIC HYSTERECTOMY WITH BILATERAL SALPINGO OOPHORECTOMY N/A 01/03/2022   Procedure:  TOTAL LAPAROSCOPIC HYSTERECTOMY WITH BILATERAL SALPINGECTOMY, LEFT OOPHORECTOMY;  Surgeon: Gae Dry, MD;  Location: [facility];  Service: Gynecology;  Laterality: N/A;   Outpatient Encounter Medications as of 05/15/2022  Medication Sig   DULoxetine (CYMBALTA) 20 MG capsule Take 1 pill every other day for 1 week, then increase 1 pill daily   No facility-administered encounter medications on file as of 05/15/2022.   Allergies  Allergen Reactions  Aspirin Anaphylaxis   Penicillins Anaphylaxis   Social History   Socioeconomic History   Marital status: Divorced    Spouse name: Not on file   Number of children: Not on file   Years of education: Not on file   Highest education level: Not on file  Occupational History   Not on file  Tobacco Use   Smoking status: Former    Years: 4.00    Types: Cigarettes    Quit date: 12/16/2012    Years since quitting: 9.4   Smokeless tobacco: Never  Vaping Use   Vaping Use: Never used  Substance and Sexual Activity   Alcohol use: No    Alcohol/week: 0.0 standard drinks   Drug use: No   Sexual activity: Not Currently  Other Topics Concern   Not on file  Social History Narrative   Not on file   Social Determinants of Health   Financial Resource Strain: Not on file  Food Insecurity: Not on file  Transportation Needs: Not on file  Physical Activity: Not on file  Stress: Not on file  Social Connections: Not on file   Family History  Problem Relation Age of Onset   Diabetes Mother    Cancer Mother    Breast cancer Mother 33   Cancer Father    Skin cancer Father    Heart attack Father    Dementia Maternal Grandmother    Stroke Paternal Grandmother    Diabetes Paternal Grandmother    Cancer Paternal Grandmother    Colon cancer Paternal Grandmother    Cancer Paternal Grandfather    Lung cancer Paternal Grandfather    Breast cancer Cousin        maternal side early 70's    Review of Systems  Constitutional:  Positive for  appetite change, fatigue and unexpected weight change. Negative for activity change, chills, diaphoresis and fever.  HENT: Negative.    Eyes: Negative.   Respiratory: Negative.    Cardiovascular: Negative.   Gastrointestinal: Negative.   Musculoskeletal: Negative.   Skin: Negative.   Neurological:  Positive for speech difficulty and headaches. Negative for dizziness, tremors, seizures, syncope, facial asymmetry, weakness, light-headedness and numbness.  Hematological: Negative.   Psychiatric/Behavioral:  Positive for confusion and decreased concentration. Negative for agitation, behavioral problems, dysphoric mood, hallucinations, self-injury, sleep disturbance and suicidal ideas. The patient is nervous/anxious. The patient is not hyperactive.    Per HPI unless specifically indicated above     Objective:    BP 127/66   Pulse 60   Temp 98.5 F (36.9 C) (Oral)   Ht 5' 4.1" (1.628 m)   Wt 137 lb 9.6 oz (62.4 kg)   LMP 12/04/2021 (Exact Date)   SpO2 99%   BMI 23.55 kg/m   Wt Readings from Last 3 Encounters:  05/15/22 137 lb 9.6 oz (62.4 kg)  04/29/22 131 lb (59.4 kg)  04/18/22 131 lb (59.4 kg)    Physical Exam Vitals and nursing note reviewed.  Constitutional:      General: She is not in acute distress.    Appearance: Normal appearance. She is normal weight. She is not ill-appearing, toxic-appearing or diaphoretic.  HENT:     Head: Normocephalic and atraumatic.     Right Ear: External ear normal.     Left Ear: External ear normal.     Nose: Nose normal.     Mouth/Throat:     Mouth: Mucous membranes are moist.     Pharynx: Oropharynx is clear.  Eyes:  General: No scleral icterus.       Right eye: No discharge.        Left eye: No discharge.     Extraocular Movements: Extraocular movements intact.     Conjunctiva/sclera: Conjunctivae normal.     Pupils: Pupils are equal, round, and reactive to light.  Cardiovascular:     Rate and Rhythm: Normal rate and regular  rhythm.     Pulses: Normal pulses.     Heart sounds: Normal heart sounds. No murmur heard.   No friction rub. No gallop.  Pulmonary:     Effort: Pulmonary effort is normal. No respiratory distress.     Breath sounds: Normal breath sounds. No stridor. No wheezing, rhonchi or rales.  Chest:     Chest wall: No tenderness.  Musculoskeletal:        General: Normal range of motion.     Cervical back: Normal range of motion and neck supple.  Skin:    General: Skin is warm and dry.     Capillary Refill: Capillary refill takes less than 2 seconds.     Coloration: Skin is not jaundiced or pale.     Findings: No bruising, erythema, lesion or rash.  Neurological:     General: No focal deficit present.     Mental Status: She is alert and oriented to person, place, and time. Mental status is at baseline.  Psychiatric:        Mood and Affect: Mood normal.        Behavior: Behavior normal.        Thought Content: Thought content normal.        Judgment: Judgment normal.    Results for orders placed or performed during the hospital encounter of 04/18/22  Surgical pathology  Result Value Ref Range   SURGICAL PATHOLOGY      SURGICAL PATHOLOGY CASE: [id number] PATIENT: Desiree Lucy Surgical Pathology Report     Specimen Submitted: A. Stomach, antrum; cbx B. Small bowel x2; cbx C. Terminal ileum x2; cbx D. Colon, random; cbx  Clinical History: Diarrhea R19.7, weight loss R63.4, abdominal pain R10.9, change in bowel habits R19.4.  Normal EGD, normal colon    DIAGNOSIS: A. STOMACH, ANTRUM; COLD BIOPSY: - GASTRIC ANTRAL AND OXYNTIC MUCOSA WITH MILD CHRONIC INACTIVE GASTRITIS. - NEGATIVE FOR H. PYLORI, DYSPLASIA, AND MALIGNANCY.  B. SMALL BOWEL; COLD BIOPSY: - ENTERIC MUCOSA WITH PRESERVED VILLOUS ARCHITECTURE AND NO SIGNIFICANT HISTOPATHOLOGIC CHANGE. - NEGATIVE FOR FEATURES OF CELIAC, DYSPLASIA, AND MALIGNANCY.  C. TERMINAL ILEUM; COLD BIOPSY: - ENTERIC MUCOSA WITH NORMAL  VILLOUS ARCHITECTURE AND REACTIVE FOLLICULAR LYMPHOID HYPERPLASIA. - NEGATIVE FOR ACTIVE ILEITIS, DYSPLASIA, AND MALIGNANCY.  D. COLON, RANDOM; COLD BIOPSY: - BENIGN COLONIC MUCOSA WITH NO SIGNIFICANT H ISTOPATHOLOGIC CHANGE. - NEGATIVE FOR ACTIVE MUCOSAL COLITIS AND FEATURES OF MICROSCOPIC COLITIS. - NEGATIVE FOR DYSPLASIA AND MALIGNANCY.  GROSS DESCRIPTION: A. Labeled: Antrum biopsy Received: Formalin Collection time: 10:48 AM on 04/18/2022 Placed into formalin time: 10:48 AM on 04/18/2022 Tissue fragment(s): 2 Size: Ranges from 0.2-0.9 cm Description: Tan soft tissue fragments Entirely submitted in 1 cassette.  B. Labeled: Small bowel biopsy x2 Received: Formalin Collection time: 11:28 AM on 04/18/2022 Placed into formalin time: 11:28 AM on 04/18/2022 Tissue fragment(s): 3 Size: Aggregate, 0.5 x 0.5 x 0.1 cm Description: Tan soft tissue fragments Entirely submitted in 1 cassette.  C. Labeled: Terminal ileum biopsy x2 Received: Formalin Collection time: 11:28 AM on 04/18/2022 Placed into formalin time: 11:28 AM on 04/18/2022 Tissue fragment(s): 2 Size:  Ranges from 0.2-0.3 cm Description: Tan soft tissue fragments Entirely submitted in 1 cassette.  D. Labeled: Ra ndom colon biopsies Received: Formalin Collection time: 11:29 AM on 04/18/2022 Placed into formalin time: 11:29 AM on 04/18/2022 Tissue fragment(s): Multiple Size: Aggregate, 1.2 x 0.5 x 0.1 cm Description: Tan soft tissue fragments Entirely submitted in 1 cassette.  CM 04/18/2022  Final Diagnosis performed by Allena Napoleon, MD.   Electronically signed 04/19/2022 10:09:51AM The electronic signature indicates that the named Attending Pathologist has evaluated the specimen Technical component performed at [facility], [address] [place], [place] [address] Lab: [id number] Dir: Rush Farmer, MD, MMM  Professional component performed at [facility], [facility], [place], [place], [place] [address] Lab:  [id number] Dir: Kathi Simpers, MD       Assessment & Plan:   Problem List Items Addressed This Visit       Nervous and Auditory   History of brain tumor    Will check MRI given memory issues. Await results.        Relevant Orders   MR Brain Wo Contrast   Ambulatory referral to Neurology   Comprehensive metabolic panel   CBC with Differential/Platelet   Bayer DCA Hb A1c Waived   VITAMIN D 25 Hydroxy (Vit-D Deficiency, Fractures)   B12   Folate   Thyroid Panel With TSH   RPR   HIV Antibody (routine testing w rflx)   Lyme Disease Serology w/Reflex   Rocky mtn spotted fvr abs pnl(IgG+IgM)   Babesia microti Antibody Panel   Ehrlichia Antibody Panel     Other   Chronic headaches    Will check MRI and labs. Await results. Treat as needed.        Relevant Medications   DULoxetine (CYMBALTA) 20 MG capsule   Other Relevant Orders   MR Brain Wo Contrast   Comprehensive metabolic panel   CBC with Differential/Platelet   Bayer DCA Hb A1c Waived   VITAMIN D 25 Hydroxy (Vit-D Deficiency, Fractures)   B12   Folate   Thyroid Panel With TSH   RPR   HIV Antibody (routine testing w rflx)   Lyme Disease Serology w/Reflex   Rocky mtn spotted fvr abs pnl(IgG+IgM)   Babesia microti Antibody Panel   Ehrlichia Antibody Panel   Weight loss    Has lost 73lbs without effort, but does note that she has been cutting back on her portions. This seems to be over a several year period. Will check labs. Up to date on cancer screening. S/P hysterectomy and has had colonoscopy which was normal. Continue to monitor.        History of depression    Denies feeling depressed at this time, but +PHQ9. Will start her on cymbalta to help with anxiety and see if it helps with other symptoms. Recheck in about 3 weeks.        Relevant Orders   B12   Folate   Thyroid Panel With TSH   RPR   HIV Antibody (routine testing w rflx)   Lyme Disease Serology w/Reflex   Rocky mtn spotted fvr abs  pnl(IgG+IgM)   Babesia microti Antibody Panel   Ehrlichia Antibody Panel   Memory loss - Primary    Having some pretty severe symptoms which have shown up over the last 6 months. Will get her MRI and labs and get her into neurology. Await results. Treat as needed.        Relevant Orders   MR Brain Wo  Contrast   Ambulatory referral to Neurology   Ambulatory referral to Neuropsychology   Comprehensive metabolic panel   CBC with Differential/Platelet   Bayer DCA Hb A1c Waived   VITAMIN D 25 Hydroxy (Vit-D Deficiency, Fractures)   B12   Folate   Thyroid Panel With TSH   RPR   HIV Antibody (routine testing w rflx)   Lyme Disease Serology w/Reflex   Rocky mtn spotted fvr abs pnl(IgG+IgM)   Babesia microti Antibody Panel   Ehrlichia Antibody Panel   Early Onset Alzheimer's Panel   Other Visit Diagnoses     Attention and concentration deficit       Will look for organic cause. Will get her into neuropsych testing. Await their input.    Relevant Orders   MR Brain Wo Contrast   Ambulatory referral to Neuropsychology   Comprehensive metabolic panel   CBC with Differential/Platelet   Bayer DCA Hb A1c Waived   VITAMIN D 25 Hydroxy (Vit-D Deficiency, Fractures)   B12   Folate   Thyroid Panel With TSH   RPR   HIV Antibody (routine testing w rflx)   Lyme Disease Serology w/Reflex   Rocky mtn spotted fvr abs pnl(IgG+IgM)   Babesia microti Antibody Panel   Ehrlichia Antibody Panel   Family history of dementia       Will check MRI and labs. Referral to neurology placed. Await results.    Relevant Orders   Early Onset Alzheimer's Panel   Encounter for screening mammogram for malignant neoplasm of breast       Mammogram ordered today.   Relevant Orders   MM DIAG BREAST TOMO BILATERAL   Screening for cholesterol level       Labs to be drawn tomorrow. Await results.    Relevant Orders   Lipid Panel w/o Chol/HDL Ratio   B12   Folate   Thyroid Panel With TSH   RPR   HIV Antibody  (routine testing w rflx)   Lyme Disease Serology w/Reflex   Rocky mtn spotted fvr abs pnl(IgG+IgM)   Babesia microti Antibody Panel   Ehrlichia Antibody Panel        Follow up plan: Return tomorrow labs, 3 weeks with me.  >45 minutes spent with patient today

## 2022-05-16 ENCOUNTER — Other Ambulatory Visit: Payer: Self-pay

## 2022-05-16 DIAGNOSIS — R413 Other amnesia: Secondary | ICD-10-CM | POA: Insufficient documentation

## 2022-05-16 DIAGNOSIS — Z87898 Personal history of other specified conditions: Secondary | ICD-10-CM | POA: Insufficient documentation

## 2022-05-16 NOTE — Assessment & Plan Note (Signed)
Having some pretty severe symptoms which have shown up over the last 6 months. Will get her MRI and labs and get her into neurology. Await results. Treat as needed.

## 2022-05-16 NOTE — Assessment & Plan Note (Signed)
Denies feeling depressed at this time, but +PHQ9. Will start her on cymbalta to help with anxiety and see if it helps with other symptoms. Recheck in about 3 weeks.

## 2022-05-16 NOTE — Assessment & Plan Note (Signed)
Will check MRI and labs. Await results. Treat as needed.

## 2022-05-16 NOTE — Assessment & Plan Note (Signed)
Has lost 73lbs without effort, but does note that she has been cutting back on her portions. This seems to be over a several year period. Will check labs. Up to date on cancer screening. S/P hysterectomy and has had colonoscopy which was normal. Continue to monitor.

## 2022-05-16 NOTE — Assessment & Plan Note (Signed)
Will check MRI given memory issues. Await results.

## 2022-05-17 ENCOUNTER — Other Ambulatory Visit: Payer: Self-pay

## 2022-05-17 ENCOUNTER — Other Ambulatory Visit: Payer: 59

## 2022-05-17 DIAGNOSIS — Z818 Family history of other mental and behavioral disorders: Secondary | ICD-10-CM

## 2022-05-17 DIAGNOSIS — Z13220 Encounter for screening for lipoid disorders: Secondary | ICD-10-CM

## 2022-05-17 DIAGNOSIS — G8929 Other chronic pain: Secondary | ICD-10-CM

## 2022-05-17 DIAGNOSIS — Z87898 Personal history of other specified conditions: Secondary | ICD-10-CM

## 2022-05-17 DIAGNOSIS — R413 Other amnesia: Secondary | ICD-10-CM

## 2022-05-17 DIAGNOSIS — R41840 Attention and concentration deficit: Secondary | ICD-10-CM

## 2022-05-17 DIAGNOSIS — Z8659 Personal history of other mental and behavioral disorders: Secondary | ICD-10-CM

## 2022-05-17 LAB — BAYER DCA HB A1C WAIVED: HB A1C (BAYER DCA - WAIVED): 5 % (ref 4.8–5.6)

## 2022-05-17 NOTE — Progress Notes (Signed)
Hi Elizabeth Peters.  Your A1c was normal which means there is no evidence of diabetes.  Continue with the plan as discussed with Dr. Wynetta Emery.

## 2022-05-19 ENCOUNTER — Other Ambulatory Visit: Payer: Self-pay | Admitting: Family Medicine

## 2022-05-19 MED ORDER — VITAMIN B-12 1000 MCG PO TABS: 1000.0000 ug | ORAL_TABLET | Freq: Every day | ORAL | 5 refills | Status: DC

## 2022-05-19 MED ORDER — VITAMIN D (ERGOCALCIFEROL) 1.25 MG (50000 UNIT) PO CAPS: 50000.0000 [IU] | ORAL_CAPSULE | ORAL | 1 refills | Status: DC

## 2022-05-19 MED ORDER — FOLIC ACID 1 MG PO TABS: 1.0000 mg | ORAL_TABLET | Freq: Every day | ORAL | 5 refills | Status: DC

## 2022-05-28 ENCOUNTER — Encounter: Payer: Self-pay | Admitting: Family Medicine

## 2022-05-29 ENCOUNTER — Telehealth: Payer: Self-pay | Admitting: Family Medicine

## 2022-05-29 NOTE — Telephone Encounter (Signed)
Copied from [place] [id number]. Topic: General - Other >> May 29, 2022 12:18 PM Ja-Kwan M wrote: Reason for CRM: [facility] called to advise that the authorization was made out to [facility] which is the wrong facility. [facility] requests that the authorization be corrected.

## 2022-05-31 ENCOUNTER — Ambulatory Visit: Payer: 59

## 2022-06-02 ENCOUNTER — Ambulatory Visit
Admission: RE | Admit: 2022-06-02 | Discharge: 2022-06-02 | Disposition: A | Discharge status: Home / Self Care | Payer: 59 | Source: Ambulatory Visit | Attending: Family Medicine | Admitting: Family Medicine

## 2022-06-02 DIAGNOSIS — R41840 Attention and concentration deficit: Secondary | ICD-10-CM

## 2022-06-02 DIAGNOSIS — R413 Other amnesia: Secondary | ICD-10-CM

## 2022-06-02 DIAGNOSIS — Z87898 Personal history of other specified conditions: Secondary | ICD-10-CM

## 2022-06-02 DIAGNOSIS — G8929 Other chronic pain: Secondary | ICD-10-CM

## 2022-06-05 ENCOUNTER — Ambulatory Visit (INDEPENDENT_AMBULATORY_CARE_PROVIDER_SITE_OTHER): Payer: 59 | Admitting: Family Medicine

## 2022-06-05 ENCOUNTER — Encounter: Payer: Self-pay | Admitting: Family Medicine

## 2022-06-05 VITALS — BP 136/80 | HR 48 | Temp 98.0°F | Wt 136.0 lb

## 2022-06-05 DIAGNOSIS — R413 Other amnesia: Secondary | ICD-10-CM

## 2022-06-05 DIAGNOSIS — R8281 Pyuria: Secondary | ICD-10-CM | POA: Diagnosis not present

## 2022-06-05 DIAGNOSIS — R82998 Other abnormal findings in urine: Secondary | ICD-10-CM

## 2022-06-05 DIAGNOSIS — Z8659 Personal history of other mental and behavioral disorders: Secondary | ICD-10-CM | POA: Diagnosis not present

## 2022-06-05 LAB — URINALYSIS, ROUTINE W REFLEX MICROSCOPIC
Bilirubin, UA: NEGATIVE
Glucose, UA: NEGATIVE
Ketones, UA: NEGATIVE
Nitrite, UA: NEGATIVE
Protein,UA: NEGATIVE
RBC, UA: NEGATIVE
Specific Gravity, UA: 1.015 (ref 1.005–1.030)
Urobilinogen, Ur: 1 mg/dL (ref 0.2–1.0)
pH, UA: 6 (ref 5.0–7.5)

## 2022-06-05 LAB — MICROSCOPIC EXAMINATION: RBC, Urine: NONE SEEN /hpf (ref 0–2)

## 2022-06-05 NOTE — Progress Notes (Signed)
BP 136/80   Pulse (!) 48   Temp 98 F (36.7 C)   Wt 136 lb (61.7 kg)   LMP 12/04/2021 (Exact Date)   SpO2 100%   BMI 23.27 kg/m    Subjective:    Patient ID: Elizabeth Peters, female    DOB: 07-20-1981, 41 y.o.   MRN: [id number]  HPI: Elizabeth Peters is a 41 y.o. female  Chief Complaint  Patient presents with   Depression    Patient has not yet started cymbalta due to starting supplements   abnormal urine    Patient states her urine has been sage green some days, no other symptoms with it.   Memory Loss    Patient states she got MRI done, but has not got in with neurology yet   URINARY SYMPTOMS Duration: 10 days Dysuria: burning Urinary frequency: no Urgency: no Small volume voids: no Symptom severity:  moderate Urinary incontinence: no Foul odor: no Hematuria: no Abdominal pain: no Back pain: no Suprapubic pain/pressure: no Flank pain: no Fever:  no Vomiting: no Relief with cranberry juice: no Relief with pyridium: no Status: stable Previous urinary tract infection: yes Recurrent urinary tract infection: no Vaginal discharge: no Treatments attempted: increasing fluids   Memory has been getting worse. Did not recognize her girlfriend of 6 years a few days ago. Has not heard from neurology yet. She was happy the MRI was normal, but is very worried about what is going on. Did not start the cymbalta because she wants to do the supplements first.   Relevant past medical, surgical, family and social history reviewed and updated as indicated. Interim medical history since our last visit reviewed. Allergies and medications reviewed and updated.  Review of Systems  Constitutional: Negative.   Respiratory: Negative.    Cardiovascular: Negative.   Genitourinary: Negative.   Musculoskeletal: Negative.   Neurological:  Positive for headaches. Negative for dizziness, tremors, seizures, syncope, facial asymmetry, speech difficulty, weakness, light-headedness and  numbness.  Hematological: Negative.   Psychiatric/Behavioral:  Positive for confusion. Negative for agitation, behavioral problems, decreased concentration, dysphoric mood, hallucinations, self-injury, sleep disturbance and suicidal ideas. The patient is nervous/anxious. The patient is not hyperactive.     Per HPI unless specifically indicated above     Objective:    BP 136/80   Pulse (!) 48   Temp 98 F (36.7 C)   Wt 136 lb (61.7 kg)   LMP 12/04/2021 (Exact Date)   SpO2 100%   BMI 23.27 kg/m   Wt Readings from Last 3 Encounters:  06/05/22 136 lb (61.7 kg)  05/15/22 137 lb 9.6 oz (62.4 kg)  04/29/22 131 lb (59.4 kg)    Physical Exam Vitals and nursing note reviewed.  Constitutional:      General: She is not in acute distress.    Appearance: Normal appearance. She is normal weight. She is not ill-appearing, toxic-appearing or diaphoretic.  HENT:     Head: Normocephalic and atraumatic.     Right Ear: External ear normal.     Left Ear: External ear normal.     Nose: Nose normal.     Mouth/Throat:     Mouth: Mucous membranes are moist.     Pharynx: Oropharynx is clear.  Eyes:     General: No scleral icterus.       Right eye: No discharge.        Left eye: No discharge.     Extraocular Movements: Extraocular movements intact.  Conjunctiva/sclera: Conjunctivae normal.     Pupils: Pupils are equal, round, and reactive to light.  Cardiovascular:     Rate and Rhythm: Normal rate and regular rhythm.     Pulses: Normal pulses.     Heart sounds: Normal heart sounds. No murmur heard.    No friction rub. No gallop.  Pulmonary:     Effort: Pulmonary effort is normal. No respiratory distress.     Breath sounds: Normal breath sounds. No stridor. No wheezing, rhonchi or rales.  Chest:     Chest wall: No tenderness.  Musculoskeletal:        General: Normal range of motion.     Cervical back: Normal range of motion and neck supple.  Skin:    General: Skin is warm and dry.      Capillary Refill: Capillary refill takes less than 2 seconds.     Coloration: Skin is not jaundiced or pale.     Findings: No bruising, erythema, lesion or rash.  Neurological:     General: No focal deficit present.     Mental Status: She is alert and oriented to person, place, and time. Mental status is at baseline.  Psychiatric:        Mood and Affect: Mood normal.        Behavior: Behavior normal.        Thought Content: Thought content normal.        Judgment: Judgment normal.    Results for orders placed or performed in visit on 06/05/22  Microscopic Examination   Urine  Result Value Ref Range   WBC, UA 0-5 0 - 5 /hpf   RBC None seen 0 - 2 /hpf   Epithelial Cells (non renal) 0-10 0 - 10 /hpf   Mucus, UA Present (A) Not Estab.   Bacteria, UA Few (A) None seen/Few  Urinalysis, Routine w reflex microscopic  Result Value Ref Range   Specific Gravity, UA 1.015 1.005 - 1.030   pH, UA 6.0 5.0 - 7.5   Color, UA Yellow Yellow   Appearance Ur Clear Clear   Leukocytes,UA Trace (A) Negative   Protein,UA Negative Negative/Trace   Glucose, UA Negative Negative   Ketones, UA Negative Negative   RBC, UA Negative Negative   Bilirubin, UA Negative Negative   Urobilinogen, Ur 1.0 0.2 - 1.0 mg/dL   Nitrite, UA Negative Negative   Microscopic Examination See below:       Assessment & Plan:   Problem List Items Addressed This Visit       Other   History of depression    Has not started her cymbalta. Wants to see how the supplements work first. Encouraged her to start her meds when ready and we'll recheck about a month after she does.       Memory loss    Has been getting worse. Normal MRI. Has not seen neurology yet. Will check on referral to neurology. Await their input. Call with any concerns.       Other Visit Diagnoses     Green-colored urine    -  Primary   + leuks. Will send for culture and treat as needed.    Relevant Orders   Urinalysis, Routine w reflex microscopic  (Completed)   Pyuria       + leuks. Will send for culture and treat as needed.    Relevant Orders   Urine Culture        Follow up plan: Return for 1 month  after she starts te cymbalta.

## 2022-06-05 NOTE — Assessment & Plan Note (Signed)
Has not started her cymbalta. Wants to see how the supplements work first. Encouraged her to start her meds when ready and we'll recheck about a month after she does.

## 2022-06-05 NOTE — Assessment & Plan Note (Signed)
Has been getting worse. Normal MRI. Has not seen neurology yet. Will check on referral to neurology. Await their input. Call with any concerns.

## 2022-06-07 LAB — URINE CULTURE

## 2022-06-11 ENCOUNTER — Encounter: Payer: Self-pay | Admitting: Family Medicine

## 2022-06-11 LAB — COMPREHENSIVE METABOLIC PANEL
ALT: 47 IU/L — ABNORMAL HIGH (ref 0–32)
AST: 29 IU/L (ref 0–40)
Albumin/Globulin Ratio: 1.6 (ref 1.2–2.2)
Albumin: 4.4 g/dL (ref 3.8–4.8)
Alkaline Phosphatase: 61 IU/L (ref 44–121)
BUN/Creatinine Ratio: 8 — ABNORMAL LOW (ref 9–23)
BUN: 7 mg/dL (ref 6–24)
Bilirubin Total: 0.5 mg/dL (ref 0.0–1.2)
CO2: 24 mmol/L (ref 20–29)
Calcium: 9.6 mg/dL (ref 8.7–10.2)
Chloride: 103 mmol/L (ref 96–106)
Creatinine, Ser: 0.84 mg/dL (ref 0.57–1.00)
Globulin, Total: 2.7 g/dL (ref 1.5–4.5)
Glucose: 101 mg/dL — ABNORMAL HIGH (ref 70–99)
Potassium: 4.1 mmol/L (ref 3.5–5.2)
Sodium: 142 mmol/L (ref 134–144)
Total Protein: 7.1 g/dL (ref 6.0–8.5)
eGFR: 89 mL/min/{1.73_m2} (ref >59)

## 2022-06-11 LAB — VITAMIN D 25 HYDROXY (VIT D DEFICIENCY, FRACTURES): Vit D, 25-Hydroxy: 20.3 ng/mL — ABNORMAL LOW (ref 30.0–100.0)

## 2022-06-11 LAB — CBC WITH DIFFERENTIAL/PLATELET
Basophils Absolute: 0 10*3/uL (ref 0.0–0.2)
Basos: 1 %
EOS (ABSOLUTE): 0 10*3/uL (ref 0.0–0.4)
Eos: 1 %
Hematocrit: 39.1 % (ref 34.0–46.6)
Hemoglobin: 13.2 g/dL (ref 11.1–15.9)
Immature Grans (Abs): 0 10*3/uL (ref 0.0–0.1)
Immature Granulocytes: 0 %
Lymphocytes Absolute: 1.3 10*3/uL (ref 0.7–3.1)
Lymphs: 23 %
MCH: 29.3 pg (ref 26.6–33.0)
MCHC: 33.8 g/dL (ref 31.5–35.7)
MCV: 87 fL (ref 79–97)
Monocytes Absolute: 0.3 10*3/uL (ref 0.1–0.9)
Monocytes: 5 %
Neutrophils Absolute: 4 10*3/uL (ref 1.4–7.0)
Neutrophils: 70 %
Platelets: 220 10*3/uL (ref 150–450)
RBC: 4.51 x10E6/uL (ref 3.77–5.28)
RDW: 12.5 % (ref 11.7–15.4)
WBC: 5.7 10*3/uL (ref 3.4–10.8)

## 2022-06-11 LAB — THYROID PANEL WITH TSH
Free Thyroxine Index: 2.9 (ref 1.2–4.9)
T3 Uptake Ratio: 27 % (ref 24–39)
T4, Total: 10.9 ug/dL (ref 4.5–12.0)
TSH: 1.19 u[IU]/mL (ref 0.450–4.500)

## 2022-06-11 LAB — VITAMIN B12: Vitamin B-12: 362 pg/mL (ref 232–1245)

## 2022-06-11 LAB — EHRLICHIA ANTIBODY PANEL
E. Chaffeensis (HME) IgM Titer: NEGATIVE
E.Chaffeensis (HME) IgG: NEGATIVE
HGE IgG Titer: NEGATIVE
HGE IgM Titer: NEGATIVE

## 2022-06-11 LAB — HIV ANTIBODY (ROUTINE TESTING W REFLEX): HIV Screen 4th Generation wRfx: NONREACTIVE

## 2022-06-11 LAB — ROCKY MTN SPOTTED FVR ABS PNL(IGG+IGM)
RMSF IgG: NEGATIVE
RMSF IgM: 0.39 index (ref 0.00–0.89)

## 2022-06-11 LAB — BABESIA MICROTI ANTIBODY PANEL
Babesia microti IgG: <1:10 {titer}
Babesia microti IgM: <1:10 {titer}

## 2022-06-11 LAB — LIPID PANEL W/O CHOL/HDL RATIO
Cholesterol, Total: 222 mg/dL — ABNORMAL HIGH (ref 100–199)
HDL: 59 mg/dL (ref >39)
LDL Chol Calc (NIH): 153 mg/dL — ABNORMAL HIGH (ref 0–99)
Triglycerides: 56 mg/dL (ref 0–149)
VLDL Cholesterol Cal: 10 mg/dL (ref 5–40)

## 2022-06-11 LAB — EARLY ONSET ALZHEIMER'S PANEL

## 2022-06-11 LAB — FOLATE: Folate: 2.5 ng/mL — ABNORMAL LOW (ref >3.0)

## 2022-06-11 LAB — LYME DISEASE SEROLOGY W/REFLEX: Lyme Total Antibody EIA: NEGATIVE

## 2022-06-11 LAB — RPR: RPR Ser Ql: NONREACTIVE

## 2022-06-12 NOTE — Telephone Encounter (Signed)
Can we please try to get her into [facility] neurology since [facility] is being difficult. Thanks.

## 2022-06-27 ENCOUNTER — Encounter: Payer: Self-pay | Admitting: Family Medicine

## 2022-07-02 ENCOUNTER — Encounter: Payer: Self-pay | Admitting: Family Medicine

## 2022-07-02 ENCOUNTER — Ambulatory Visit (INDEPENDENT_AMBULATORY_CARE_PROVIDER_SITE_OTHER): Payer: 59 | Admitting: Family Medicine

## 2022-07-02 VITALS — BP 109/65 | HR 101 | Temp 98.6°F | Wt 127.6 lb

## 2022-07-02 DIAGNOSIS — M5440 Lumbago with sciatica, unspecified side: Secondary | ICD-10-CM | POA: Diagnosis not present

## 2022-07-02 DIAGNOSIS — G8929 Other chronic pain: Secondary | ICD-10-CM | POA: Diagnosis not present

## 2022-07-02 DIAGNOSIS — R1011 Right upper quadrant pain: Secondary | ICD-10-CM | POA: Diagnosis not present

## 2022-07-02 DIAGNOSIS — M5442 Lumbago with sciatica, left side: Secondary | ICD-10-CM

## 2022-07-02 DIAGNOSIS — R413 Other amnesia: Secondary | ICD-10-CM

## 2022-07-02 NOTE — Progress Notes (Signed)
BP 109/65   Pulse (!) 101   Temp 98.6 F (37 C)   Wt 127 lb 9.6 oz (57.9 kg)   LMP 12/04/2021 (Exact Date)   SpO2 100%   BMI 21.83 kg/m    Subjective:    Patient ID: Elizabeth Peters, female    DOB: 08-30-1981, 41 y.o.   MRN: [id number]  HPI: Elizabeth Peters is a 41 y.o. female  Chief Complaint  Patient presents with   GI Problem    Patient states she has been having daily abdominal pain that radiates to her lower back, also has daily loose stools about 3-4x a day. Patient states she is also vomiting but not daily.    Did not start her cymbalta. Notes that her memory loss has continued to get significantly worse. She has not been able to get in with neurology. She is pleased that her early alzheimer's test was negative and that her MRI was normal, but she feels like something is significantly wrong and she's very frustrated. She continues to have issues with getting loss and recognizing her partner.   She has been having pain in her low back as well as pain in her joints. She notes that she saw a spine specialists and was told that she had cysts on her spine, but nothing was done about it. She is not taking any medicine for her back. She has not done PT.   ABDOMINAL ISSUES- loss control of her bowels a couple of weeks ago, no warnings, has been seeing spine specialist for a couple of years. Has known cysts on her spine. She had a colonoscopy about 2 months ago which was normal Duration: years, worse in the past 2 weeks Nature: sharp stabbing Location: RUQ  Severity: severe  Radiation: radiating into back and ribs Episode duration: constant, worse with eating Frequency: constant, but waxing and waning Alleviating factors: heating pad Aggravating factors: eating Constipation: no Diarrhea: yes Episodes of diarrhea/day: 3-4x day without eating, with eating 75mn after eating Mucous in the stool: yes Heartburn: no Bloating:yes Flatulence: yes Nausea: yes Vomiting: yes Episodes  of vomit/day: occasionally Melena or hematochezia: no Rash: no Jaundice: no Fever: no Weight loss: yes  Relevant past medical, surgical, family and social history reviewed and updated as indicated. Interim medical history since our last visit reviewed. Allergies and medications reviewed and updated.  Review of Systems  Constitutional:  Positive for fatigue and unexpected weight change. Negative for activity change, appetite change, chills, diaphoresis and fever.  HENT: Negative.    Respiratory: Negative.    Cardiovascular: Negative.   Gastrointestinal:  Positive for abdominal distention, abdominal pain, diarrhea, nausea and vomiting. Negative for anal bleeding, blood in stool, constipation and rectal pain.  Musculoskeletal:  Positive for arthralgias, back pain, gait problem and myalgias. Negative for joint swelling, neck pain and neck stiffness.  Skin: Negative.   Neurological:  Positive for weakness, numbness and headaches. Negative for dizziness, tremors, seizures, syncope, facial asymmetry, speech difficulty and light-headedness.  Hematological: Negative.   Psychiatric/Behavioral:  Positive for confusion and decreased concentration. Negative for agitation, behavioral problems, dysphoric mood, hallucinations, self-injury, sleep disturbance and suicidal ideas. The patient is not nervous/anxious and is not hyperactive.     Per HPI unless specifically indicated above     Objective:    BP 109/65   Pulse (!) 101   Temp 98.6 F (37 C)   Wt 127 lb 9.6 oz (57.9 kg)   LMP 12/04/2021 (Exact Date)   SpO2  100%   BMI 21.83 kg/m   Wt Readings from Last 3 Encounters:  07/02/22 127 lb 9.6 oz (57.9 kg)  06/05/22 136 lb (61.7 kg)  05/15/22 137 lb 9.6 oz (62.4 kg)    Physical Exam Vitals and nursing note reviewed.  Constitutional:      General: She is not in acute distress.    Appearance: Normal appearance. She is normal weight. She is not ill-appearing, toxic-appearing or diaphoretic.   HENT:     Head: Normocephalic and atraumatic.     Right Ear: External ear normal.     Left Ear: External ear normal.     Nose: Nose normal.     Mouth/Throat:     Mouth: Mucous membranes are moist.     Pharynx: Oropharynx is clear.  Eyes:     General: No scleral icterus.       Right eye: No discharge.        Left eye: No discharge.     Extraocular Movements: Extraocular movements intact.     Conjunctiva/sclera: Conjunctivae normal.     Pupils: Pupils are equal, round, and reactive to light.  Cardiovascular:     Rate and Rhythm: Normal rate and regular rhythm.     Pulses: Normal pulses.     Heart sounds: Normal heart sounds. No murmur heard.    No friction rub. No gallop.  Pulmonary:     Effort: Pulmonary effort is normal. No respiratory distress.     Breath sounds: Normal breath sounds. No stridor. No wheezing, rhonchi or rales.  Chest:     Chest wall: No tenderness.  Abdominal:     General: Abdomen is flat. Bowel sounds are normal.     Palpations: Abdomen is soft.     Tenderness: There is abdominal tenderness (RUQ).  Musculoskeletal:        General: Normal range of motion.     Cervical back: Normal range of motion and neck supple.  Skin:    General: Skin is warm and dry.     Capillary Refill: Capillary refill takes less than 2 seconds.     Coloration: Skin is not jaundiced or pale.     Findings: No bruising, erythema, lesion or rash.  Neurological:     General: No focal deficit present.     Mental Status: She is alert and oriented to person, place, and time. Mental status is at baseline.  Psychiatric:        Mood and Affect: Mood normal.        Behavior: Behavior normal.        Thought Content: Thought content normal.        Judgment: Judgment normal.     Results for orders placed or performed in visit on 06/05/22  Microscopic Examination   Urine  Result Value Ref Range   WBC, UA 0-5 0 - 5 /hpf   RBC, Urine None seen 0 - 2 /hpf   Epithelial Cells (non renal)  0-10 0 - 10 /hpf   Mucus, UA Present (A) Not Estab.   Bacteria, UA Few (A) None seen/Few  Urine Culture   Specimen: Urine   UR  Result Value Ref Range   Urine Culture, Routine Final report    Organism ID, Bacteria Comment   Urinalysis, Routine w reflex microscopic  Result Value Ref Range   Specific Gravity, UA 1.015 1.005 - 1.030   pH, UA 6.0 5.0 - 7.5   Color, UA Yellow Yellow   Appearance Ur Clear  Clear   Leukocytes,UA Trace (A) Negative   Protein,UA Negative Negative/Trace   Glucose, UA Negative Negative   Ketones, UA Negative Negative   RBC, UA Negative Negative   Bilirubin, UA Negative Negative   Urobilinogen, Ur 1.0 0.2 - 1.0 mg/dL   Nitrite, UA Negative Negative   Microscopic Examination See below:       Assessment & Plan:   Problem List Items Addressed This Visit       Other   Memory loss    Continues to get worse. Has not gotten in for neuropsych testing yet- encouraged her to call them. Has not seen neurology yet. MRI was normal. Appointment scheduled for neurology today. To see them 07/16/22.       Low back pain    Will obtain records from previous spine specialist. Will review and continue work up and treatment. Call with any concerns. Continue to monitor.       Other Visit Diagnoses     RUQ pain    -  Primary   Has not had US done yet- will get that set. If normal as expected based on normal CT, will get her set up for HIDA. Will also try to get her back into GI.    Relevant Orders   US Abdomen Limited RUQ (LIVER/GB)        Follow up plan: Return in about 3 weeks (around 07/23/2022) for records release spine doctor please.   >40 minutes spent with patient and partner today

## 2022-07-02 NOTE — Patient Instructions (Signed)
Neurology August 1st @ 3:15 arrive by 2:45 for new patient paper work  [address]  [place] [id number]

## 2022-07-04 DIAGNOSIS — M545 Low back pain, unspecified: Secondary | ICD-10-CM | POA: Insufficient documentation

## 2022-07-04 NOTE — Assessment & Plan Note (Signed)
Continues to get worse. Has not gotten in for neuropsych testing yet- encouraged her to call them. Has not seen neurology yet. MRI was normal. Appointment scheduled for neurology today. To see them 07/16/22.

## 2022-07-04 NOTE — Assessment & Plan Note (Signed)
Will obtain records from previous spine specialist. Will review and continue work up and treatment. Call with any concerns. Continue to monitor.

## 2022-07-05 ENCOUNTER — Telehealth: Payer: 59 | Admitting: Family Medicine

## 2022-07-09 ENCOUNTER — Telehealth: Payer: Self-pay

## 2022-07-09 NOTE — Telephone Encounter (Signed)
OK. Can we please send notes. Patient has seen them before like 4 months ago

## 2022-07-09 NOTE — Telephone Encounter (Signed)
-----   Message from Elizabeth Peters, [place] sent at 07/04/2022  7:09 PM EDT ----- Patient would like to see Dr. Haig Prophet with GI again- last saw him in April- can we see about getting her rescheduled with them?  Also Evelena Peat- RUQ was ordered just wanted to make sure you know it's in there.

## 2022-07-09 NOTE — Telephone Encounter (Signed)
I will do it Elizabeth Peters

## 2022-07-09 NOTE — Telephone Encounter (Signed)
Dr. Drinda Butts office is booked out until November. Per receptionist patient could possibly get in sooner if recent OV notes are sent for review by provider, and is necessary to be seen sooner than that.

## 2022-07-10 NOTE — Telephone Encounter (Signed)
Most recent office visit notes have been sent to Dr. Helane Rima office.

## 2022-07-15 ENCOUNTER — Other Ambulatory Visit: Payer: Self-pay | Admitting: Family Medicine

## 2022-07-15 ENCOUNTER — Ambulatory Visit
Admission: RE | Admit: 2022-07-15 | Discharge: 2022-07-15 | Disposition: A | Discharge status: Home / Self Care | Payer: 59 | Source: Ambulatory Visit | Attending: Family Medicine | Admitting: Family Medicine

## 2022-07-15 DIAGNOSIS — R1011 Right upper quadrant pain: Secondary | ICD-10-CM | POA: Insufficient documentation

## 2022-07-15 DIAGNOSIS — K8020 Calculus of gallbladder without cholecystitis without obstruction: Secondary | ICD-10-CM

## 2022-07-16 ENCOUNTER — Ambulatory Visit: Payer: 59 | Admitting: Neurology

## 2022-07-16 VITALS — BP 149/75 | HR 79 | Ht 64.0 in | Wt 127.0 lb

## 2022-07-16 DIAGNOSIS — G3184 Mild cognitive impairment, so stated: Secondary | ICD-10-CM

## 2022-07-16 DIAGNOSIS — D1809 Hemangioma of other sites: Secondary | ICD-10-CM | POA: Diagnosis not present

## 2022-07-16 DIAGNOSIS — U099 Post covid-19 condition, unspecified: Secondary | ICD-10-CM | POA: Diagnosis not present

## 2022-07-16 DIAGNOSIS — M792 Neuralgia and neuritis, unspecified: Secondary | ICD-10-CM

## 2022-07-16 MED ORDER — OXCARBAZEPINE 150 MG PO TABS: 150.0000 mg | ORAL_TABLET | Freq: Two times a day (BID) | ORAL | 3 refills | Status: DC
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
## 2022-07-16 NOTE — Patient Instructions (Addendum)
Follow up with [place] Attention Specialist as schedule  Provide CD of recent thoracic spine MRI  Follow up with Neuropsych as scheduled  Start Trileptal 150 mg BID for neuralgia pain

## 2022-07-17 ENCOUNTER — Encounter: Payer: Self-pay | Admitting: Neurology

## 2022-07-23 ENCOUNTER — Ambulatory Visit: Payer: 59 | Admitting: Family Medicine

## 2022-07-25 ENCOUNTER — Encounter: Payer: Self-pay | Admitting: Surgery

## 2022-07-25 ENCOUNTER — Ambulatory Visit: Payer: 59 | Admitting: Surgery

## 2022-07-25 ENCOUNTER — Ambulatory Visit: Payer: Self-pay | Admitting: Surgery

## 2022-07-25 VITALS — BP 144/67 | HR 68 | Temp 98.3°F | Ht 65.0 in | Wt 130.4 lb

## 2022-07-25 DIAGNOSIS — K8010 Calculus of gallbladder with chronic cholecystitis without obstruction: Secondary | ICD-10-CM

## 2022-07-25 NOTE — Progress Notes (Signed)
Patient ID: Elizabeth Peters, female   DOB: May 07, 1981, 41 y.o.   MRN: [id number]  Chief Complaint: Gallstones for 1 year.  History of Present Illness Elizabeth Peters is a 41 y.o. female with a 1 year history of ill-defined nausea/vomiting with loss of appetite and extensive workup.  Workups included upper and lower endoscopy, CT scans ultrasound with repeat ultrasound recently that revealed gallstones.  She reports she has had severe pain after eating and has lost about 100 pounds.  She denies any history of jaundice, she has had recent light-colored/chalky stools.  But at that time she did not have discoloration of her urine.  Past Medical History Past Medical History:  Diagnosis Date   Anxiety    Brain tumor (benign) ([place]) 2011   Chronic pain due to injury    spine   Endometriosis    Family history of adverse reaction to anesthesia    Mother and sister - PONV   PONV (postoperative nausea and vomiting)    after hysterectomy   Wears hearing aid in left ear       Past Surgical History:  Procedure Laterality Date   BIOPSY N/A 04/18/2022   Procedure: BIOPSY;  Surgeon: Lucilla Lame, MD;  Location: [place];  Service: Endoscopy;  Laterality: N/A;   BRAIN SURGERY  2011   tumor   COLONOSCOPY N/A 04/18/2022   Procedure: COLONOSCOPY;  Surgeon: Lucilla Lame, MD;  Location: [place];  Service: Endoscopy;  Laterality: N/A;   CYSTOSCOPY N/A 01/03/2022   Procedure: CYSTOSCOPY;  Surgeon: Gae Dry, MD;  Location: [facility];  Service: Gynecology;  Laterality: N/A;   DILATION AND CURETTAGE OF UTERUS  2007   ESOPHAGOGASTRODUODENOSCOPY N/A 04/18/2022   Procedure: ESOPHAGOGASTRODUODENOSCOPY (EGD);  Surgeon: Lucilla Lame, MD;  Location: [place];  Service: Endoscopy;  Laterality: N/A;   neck fusion     C6-C7 fusion   SHOULDER SURGERY Left 2011   SPINE SURGERY  2013   neck fulsion   TONSILLECTOMY  2008   TOTAL LAPAROSCOPIC HYSTERECTOMY WITH BILATERAL SALPINGO  OOPHORECTOMY N/A 01/03/2022   Procedure: TOTAL LAPAROSCOPIC HYSTERECTOMY WITH BILATERAL SALPINGECTOMY, LEFT OOPHORECTOMY;  Surgeon: Gae Dry, MD;  Location: [facility];  Service: Gynecology;  Laterality: N/A;    Allergies  Allergen Reactions   Aspirin Anaphylaxis   Penicillins Anaphylaxis    Current Outpatient Medications  Medication Sig Dispense Refill   OXcarbazepine (TRILEPTAL) 150 MG tablet Take 1 tablet (150 mg total) by mouth 2 (two) times daily. 60 tablet 3   No current facility-administered medications for this visit.    Family History Family History  Problem Relation Age of Onset   Diabetes Mother    Cancer Mother    Breast cancer Mother 81   Cancer Father    Skin cancer Father    Heart attack Father    Dementia Maternal Grandmother    Stroke Paternal Grandmother    Diabetes Paternal Grandmother    Cancer Paternal Grandmother    Colon cancer Paternal Grandmother    Cancer Paternal Grandfather    Lung cancer Paternal Grandfather    Breast cancer Cousin        maternal side early 89's      Social History Social History   Tobacco Use   Smoking status: Former    Years: 4.00    Types: Cigarettes    Quit date: 12/16/2012    Years since quitting: 9.6   Smokeless tobacco: Never  Vaping Use   Vaping  Use: Never used  Substance Use Topics   Alcohol use: No    Alcohol/week: 0.0 standard drinks of alcohol   Drug use: No      Review of Systems  Constitutional:  Positive for chills, fever, malaise/fatigue and weight loss.  HENT:  Positive for tinnitus.   Eyes: Negative.   Respiratory: Negative.    Cardiovascular: Negative.   Gastrointestinal:  Positive for abdominal pain, diarrhea, nausea and vomiting.  Genitourinary:  Positive for hematuria (Changes in urine color, noted to be green and associated with pressure).  Skin:  Positive for itching.  Neurological:  Positive for tingling, sensory change and headaches.  Psychiatric/Behavioral: Negative.         Physical Exam Last menstrual period 12/04/2021.   CONSTITUTIONAL: Well developed, appropriately responsive and aware without distress.   EYES: Sclera non-icteric.   EARS, NOSE, MOUTH AND THROAT:  The oropharynx is clear. Oral mucosa is pink and moist.   Hearing is intact to voice.  NECK: Trachea is midline, and there is no jugular venous distension.  LYMPH NODES:  Lymph nodes in the neck are not enlarged. RESPIRATORY:  Lungs are clear, and breath sounds are equal bilaterally. Normal respiratory effort without pathologic use of accessory muscles. CARDIOVASCULAR: Heart is regular in rate and rhythm. GI: The abdomen is soft, nontender, and nondistended.  Some subjective right upper quadrant tenderness.  There were no palpable masses. I did not appreciate hepatosplenomegaly. There were normal bowel sounds. MUSCULOSKELETAL:  Symmetrical muscle tone appreciated in all four extremities.    SKIN: Skin turgor is normal. No pathologic skin lesions appreciated.  NEUROLOGIC:  Motor and sensation appear grossly normal.  Cranial nerves are grossly without defect. PSYCH:  Alert and oriented to person, place and time. Affect is appropriate for situation.  Data Reviewed I have personally reviewed what is currently available of the patient's imaging, recent labs and medical records.   Labs:     Latest Ref Rng & Units 05/17/2022    8:37 AM 01/04/2022    3:16 AM 01/03/2022   10:38 AM  CBC  WBC 3.4 - 10.8 x10E3/uL 5.7  14.5  7.5   Hemoglobin 11.1 - 15.9 g/dL 13.2  12.5  12.4   Hematocrit 34.0 - 46.6 % 39.1  36.4  36.5   Platelets 150 - 450 x10E3/uL 220  295  261       Latest Ref Rng & Units 05/17/2022    8:37 AM 01/04/2022    3:16 AM 09/17/2021   11:41 AM  CMP  Glucose 70 - 99 mg/dL 101  166  110   BUN 6 - 24 mg/dL '7  11  7   '$ Creatinine 0.57 - 1.00 mg/dL 0.84  0.76  0.88   Sodium 134 - 144 mmol/L 142  133  134   Potassium 3.5 - 5.2 mmol/L 4.1  3.8  3.6   Chloride 96 - 106 mmol/L 103  103  105   CO2  20 - 29 mmol/L '24  23  27   '$ Calcium 8.7 - 10.2 mg/dL 9.6  8.9  8.9   Total Protein 6.0 - 8.5 g/dL 7.1  7.3  7.5   Total Bilirubin 0.0 - 1.2 mg/dL 0.5  0.8  0.7   Alkaline Phos 44 - 121 IU/L 61  59  63   AST 0 - 40 IU/L '29  16  20   '$ ALT 0 - 32 IU/L 47  10  21       Imaging: Radiology  review:  CLINICAL DATA:  Right upper quadrant abdominal pain.   EXAM: ULTRASOUND ABDOMEN LIMITED RIGHT UPPER QUADRANT   COMPARISON:  CT abdomen pelvis 01/04/2022   FINDINGS: Gallbladder:   Cholelithiasis. No gallbladder wall thickening or pericholecystic fluid.   Common bile duct:   Diameter: 5.8 mm, mildly dilated.   Liver:   No focal lesion identified. Within normal limits in parenchymal echogenicity. Portal vein is patent on color Doppler imaging with normal direction of blood flow towards the liver.   Other: None.   IMPRESSION: Cholelithiasis. No gallbladder wall thickening or pericholecystic fluid.   The common bile duct is mildly dilated measuring approximately 6 mm. Recommend correlation with LFTs.     Electronically Signed   By: Lovey Newcomer M.D.   On: 07/15/2022 13:37 Within last 24 hrs: No results found.  Assessment    Chronic calculus cholecystitis with biliary colic  Patient Active Problem List   Diagnosis Date Noted   Low back pain 07/04/2022   History of brain tumor 05/16/2022   Memory loss 05/16/2022   History of depression 05/15/2022   Weight loss    Endometriosis 10/08/2021   Chronic headaches 10/11/2020   DDD (degenerative disc disease), cervical 10/11/2020   Insomnia 10/11/2020   Enthesopathy of right hip 03/07/2020    Plan    Robotic cholecystectomy with ICG imaging. Will repeat CMP and CBC preop.  This was discussed thoroughly.  Optimal plan is for robotic cholecystectomy utilizing ICG imaging. Risks and benefits have been discussed with the patient which include but are not limited to anesthesia, bleeding, infection, biliary ductal injury,  resulting in leak or stenosis, other associated unanticipated injuries affiliated with laparoscopic surgery.   Reviewed that removing the gallbladder will only address the symptoms related to the gallbladder itself.  I believe there is the desire to proceed, accepting the risks with understanding.  Questions elicited and answered to satisfaction.    No guarantees ever expressed or implied.   Face-to-face time spent with the patient and accompanying care providers(if present) was 30 minutes, with more than 50% of the time spent counseling, educating, and coordinating care of the patient.    These notes generated with voice recognition software. I apologize for typographical errors.  Ronny Bacon M.D., FACS 07/25/2022, 3:48 PM

## 2022-07-25 NOTE — H&P (View-Only) (Signed)
Patient ID: Elizabeth Peters, female   DOB: 12-Oct-1981, 41 y.o.   MRN: [id number]  Chief Complaint: Gallstones for 1 year.  History of Present Illness Elizabeth Peters is a 41 y.o. female with a 1 year history of ill-defined nausea/vomiting with loss of appetite and extensive workup.  Workups included upper and lower endoscopy, CT scans ultrasound with repeat ultrasound recently that revealed gallstones.  She reports she has had severe pain after eating and has lost about 100 pounds.  She denies any history of jaundice, she has had recent light-colored/chalky stools.  But at that time she did not have discoloration of her urine.  Past Medical History Past Medical History:  Diagnosis Date   Anxiety    Brain tumor (benign) ([place]) 2011   Chronic pain due to injury    spine   Endometriosis    Family history of adverse reaction to anesthesia    Mother and sister - PONV   PONV (postoperative nausea and vomiting)    after hysterectomy   Wears hearing aid in left ear       Past Surgical History:  Procedure Laterality Date   BIOPSY N/A 04/18/2022   Procedure: BIOPSY;  Surgeon: Lucilla Lame, MD;  Location: [place];  Service: Endoscopy;  Laterality: N/A;   BRAIN SURGERY  2011   tumor   COLONOSCOPY N/A 04/18/2022   Procedure: COLONOSCOPY;  Surgeon: Lucilla Lame, MD;  Location: [place];  Service: Endoscopy;  Laterality: N/A;   CYSTOSCOPY N/A 01/03/2022   Procedure: CYSTOSCOPY;  Surgeon: Gae Dry, MD;  Location: [facility];  Service: Gynecology;  Laterality: N/A;   DILATION AND CURETTAGE OF UTERUS  2007   ESOPHAGOGASTRODUODENOSCOPY N/A 04/18/2022   Procedure: ESOPHAGOGASTRODUODENOSCOPY (EGD);  Surgeon: Lucilla Lame, MD;  Location: [place];  Service: Endoscopy;  Laterality: N/A;   neck fusion     C6-C7 fusion   SHOULDER SURGERY Left 2011   SPINE SURGERY  2013   neck fulsion   TONSILLECTOMY  2008   TOTAL LAPAROSCOPIC HYSTERECTOMY WITH BILATERAL SALPINGO  OOPHORECTOMY N/A 01/03/2022   Procedure: TOTAL LAPAROSCOPIC HYSTERECTOMY WITH BILATERAL SALPINGECTOMY, LEFT OOPHORECTOMY;  Surgeon: Gae Dry, MD;  Location: [facility];  Service: Gynecology;  Laterality: N/A;    Allergies  Allergen Reactions   Aspirin Anaphylaxis   Penicillins Anaphylaxis    Current Outpatient Medications  Medication Sig Dispense Refill   OXcarbazepine (TRILEPTAL) 150 MG tablet Take 1 tablet (150 mg total) by mouth 2 (two) times daily. 60 tablet 3   No current facility-administered medications for this visit.    Family History Family History  Problem Relation Age of Onset   Diabetes Mother    Cancer Mother    Breast cancer Mother 54   Cancer Father    Skin cancer Father    Heart attack Father    Dementia Maternal Grandmother    Stroke Paternal Grandmother    Diabetes Paternal Grandmother    Cancer Paternal Grandmother    Colon cancer Paternal Grandmother    Cancer Paternal Grandfather    Lung cancer Paternal Grandfather    Breast cancer Cousin        maternal side early 25's      Social History Social History   Tobacco Use   Smoking status: Former    Years: 4.00    Types: Cigarettes    Quit date: 12/16/2012    Years since quitting: 9.6   Smokeless tobacco: Never  Vaping Use   Vaping  Use: Never used  Substance Use Topics   Alcohol use: No    Alcohol/week: 0.0 standard drinks of alcohol   Drug use: No      Review of Systems  Constitutional:  Positive for chills, fever, malaise/fatigue and weight loss.  HENT:  Positive for tinnitus.   Eyes: Negative.   Respiratory: Negative.    Cardiovascular: Negative.   Gastrointestinal:  Positive for abdominal pain, diarrhea, nausea and vomiting.  Genitourinary:  Positive for hematuria (Changes in urine color, noted to be green and associated with pressure).  Skin:  Positive for itching.  Neurological:  Positive for tingling, sensory change and headaches.  Psychiatric/Behavioral: Negative.         Physical Exam Last menstrual period 12/04/2021.   CONSTITUTIONAL: Well developed, appropriately responsive and aware without distress.   EYES: Sclera non-icteric.   EARS, NOSE, MOUTH AND THROAT:  The oropharynx is clear. Oral mucosa is pink and moist.   Hearing is intact to voice.  NECK: Trachea is midline, and there is no jugular venous distension.  LYMPH NODES:  Lymph nodes in the neck are not enlarged. RESPIRATORY:  Lungs are clear, and breath sounds are equal bilaterally. Normal respiratory effort without pathologic use of accessory muscles. CARDIOVASCULAR: Heart is regular in rate and rhythm. GI: The abdomen is soft, nontender, and nondistended.  Some subjective right upper quadrant tenderness.  There were no palpable masses. I did not appreciate hepatosplenomegaly. There were normal bowel sounds. MUSCULOSKELETAL:  Symmetrical muscle tone appreciated in all four extremities.    SKIN: Skin turgor is normal. No pathologic skin lesions appreciated.  NEUROLOGIC:  Motor and sensation appear grossly normal.  Cranial nerves are grossly without defect. PSYCH:  Alert and oriented to person, place and time. Affect is appropriate for situation.  Data Reviewed I have personally reviewed what is currently available of the patient's imaging, recent labs and medical records.   Labs:     Latest Ref Rng & Units 05/17/2022    8:37 AM 01/04/2022    3:16 AM 01/03/2022   10:38 AM  CBC  WBC 3.4 - 10.8 x10E3/uL 5.7  14.5  7.5   Hemoglobin 11.1 - 15.9 g/dL 13.2  12.5  12.4   Hematocrit 34.0 - 46.6 % 39.1  36.4  36.5   Platelets 150 - 450 x10E3/uL 220  295  261       Latest Ref Rng & Units 05/17/2022    8:37 AM 01/04/2022    3:16 AM 09/17/2021   11:41 AM  CMP  Glucose 70 - 99 mg/dL 101  166  110   BUN 6 - 24 mg/dL '7  11  7   '$ Creatinine 0.57 - 1.00 mg/dL 0.84  0.76  0.88   Sodium 134 - 144 mmol/L 142  133  134   Potassium 3.5 - 5.2 mmol/L 4.1  3.8  3.6   Chloride 96 - 106 mmol/L 103  103  105   CO2  20 - 29 mmol/L '24  23  27   '$ Calcium 8.7 - 10.2 mg/dL 9.6  8.9  8.9   Total Protein 6.0 - 8.5 g/dL 7.1  7.3  7.5   Total Bilirubin 0.0 - 1.2 mg/dL 0.5  0.8  0.7   Alkaline Phos 44 - 121 IU/L 61  59  63   AST 0 - 40 IU/L '29  16  20   '$ ALT 0 - 32 IU/L 47  10  21       Imaging: Radiology  review:  CLINICAL DATA:  Right upper quadrant abdominal pain.   EXAM: ULTRASOUND ABDOMEN LIMITED RIGHT UPPER QUADRANT   COMPARISON:  CT abdomen pelvis 01/04/2022   FINDINGS: Gallbladder:   Cholelithiasis. No gallbladder wall thickening or pericholecystic fluid.   Common bile duct:   Diameter: 5.8 mm, mildly dilated.   Liver:   No focal lesion identified. Within normal limits in parenchymal echogenicity. Portal vein is patent on color Doppler imaging with normal direction of blood flow towards the liver.   Other: None.   IMPRESSION: Cholelithiasis. No gallbladder wall thickening or pericholecystic fluid.   The common bile duct is mildly dilated measuring approximately 6 mm. Recommend correlation with LFTs.     Electronically Signed   By: Lovey Newcomer M.D.   On: 07/15/2022 13:37 Within last 24 hrs: No results found.  Assessment    Chronic calculus cholecystitis with biliary colic  Patient Active Problem List   Diagnosis Date Noted   Low back pain 07/04/2022   History of brain tumor 05/16/2022   Memory loss 05/16/2022   History of depression 05/15/2022   Weight loss    Endometriosis 10/08/2021   Chronic headaches 10/11/2020   DDD (degenerative disc disease), cervical 10/11/2020   Insomnia 10/11/2020   Enthesopathy of right hip 03/07/2020    Plan    Robotic cholecystectomy with ICG imaging. Will repeat CMP and CBC preop.  This was discussed thoroughly.  Optimal plan is for robotic cholecystectomy utilizing ICG imaging. Risks and benefits have been discussed with the patient which include but are not limited to anesthesia, bleeding, infection, biliary ductal injury,  resulting in leak or stenosis, other associated unanticipated injuries affiliated with laparoscopic surgery.   Reviewed that removing the gallbladder will only address the symptoms related to the gallbladder itself.  I believe there is the desire to proceed, accepting the risks with understanding.  Questions elicited and answered to satisfaction.    No guarantees ever expressed or implied.   Face-to-face time spent with the patient and accompanying care providers(if present) was 30 minutes, with more than 50% of the time spent counseling, educating, and coordinating care of the patient.    These notes generated with voice recognition software. I apologize for typographical errors.  Ronny Bacon M.D., FACS 07/25/2022, 3:48 PM

## 2022-07-25 NOTE — Patient Instructions (Signed)
Our surgery scheduler Barbara will call you within 24-48 hours to get you scheduled. If you have not heard from her after 48 hours, please call our office. Have the blue sheet available when she calls to write down important information.   If you have any concerns or questions, please feel free to call our office.    Minimally Invasive Cholecystectomy  Minimally invasive cholecystectomy is surgery to remove the gallbladder. The gallbladder is a pear-shaped organ that lies beneath the liver on the right side of the body. The gallbladder stores bile, which is a fluid that helps the body digest fats. Cholecystectomy is often done to treat inflammation (irritation and swelling) of the gallbladder (cholecystitis). This condition is usually caused by a buildup of gallstones (cholelithiasis) in the gallbladder or when the fluid in the gall bladder becomes stagnant because gallstones get stuck in the ducts (tubes) and block the flow of bile. This can result in inflammation and pain. In severe cases, emergency surgery may be required. This procedure is done through small incisions in the abdomen, instead of one large incision. It is also called laparoscopic surgery. A thin scope with a camera (laparoscope) is inserted through one incision. Then surgical instruments are inserted through the other incisions. In some cases, a minimally invasive surgery may need to be changed to a surgery that is done through a larger incision. This is called open surgery. Tell a health care provider about: Any allergies you have. All medicines you are taking, including vitamins, herbs, eye drops, creams, and over-the-counter medicines. Any problems you or family members have had with anesthetic medicines. Any bleeding problems you have. Any surgeries you have had. Any medical conditions you have. Whether you are pregnant or may be pregnant. What are the risks? Generally, this is a safe procedure. However, problems may occur,  including: Infection. Bleeding. Allergic reactions to medicines. Damage to nearby structures or organs. A gallstone remaining in the common bile duct. The common bile duct carries bile from the gallbladder to the small intestine. A bile leak from the liver or cystic duct after your gallbladder is removed. What happens before the procedure? When to stop eating and drinking Follow instructions from your health care provider about what you may eat and drink before your procedure. These may include: 8 hours before the procedure Stop eating most foods. Do not eat meat, fried foods, or fatty foods. Eat only light foods, such as toast or crackers. All liquids are okay except energy drinks and alcohol. 6 hours before the procedure Stop eating. Drink only clear liquids, such as water, clear fruit juice, black coffee, plain tea, and sports drinks. Do not drink energy drinks or alcohol. 2 hours before the procedure Stop drinking all liquids. You may be allowed to take medicines with small sips of water. If you do not follow your health care provider's instructions, your procedure may be delayed or canceled. Medicines Ask your health care provider about: Changing or stopping your regular medicines. This is especially important if you are taking diabetes medicines or blood thinners. Taking medicines such as aspirin and ibuprofen. These medicines can thin your blood. Do not take these medicines unless your health care provider tells you to take them. Taking over-the-counter medicines, vitamins, herbs, and supplements. General instructions If you will be going home right after the procedure, plan to have a responsible adult: Take you home from the hospital or clinic. You will not be allowed to drive. Care for you for the time you are   told. Do not use any products that contain nicotine or tobacco for at least 4 weeks before the procedure. These products include cigarettes, chewing tobacco, and vaping  devices, such as e-cigarettes. If you need help quitting, ask your health care provider. Ask your health care provider: How your surgery site will be marked. What steps will be taken to help prevent infection. These may include: Removing hair at the surgery site. Washing skin with a germ-killing soap. Taking antibiotic medicine. What happens during the procedure?  An IV will be inserted into one of your veins. You will be given one or both of the following: A medicine to help you relax (sedative). A medicine to make you fall asleep (general anesthetic). Your surgeon will make several small incisions in your abdomen. The laparoscope will be inserted through one of the small incisions. The camera on the laparoscope will send images to a monitor in the operating room. This lets your surgeon see inside your abdomen. A gas will be pumped into your abdomen. This will expand your abdomen to give the surgeon more room to perform the surgery. Other tools that are needed for the procedure will be inserted through the other incisions. The gallbladder will be removed through one of the incisions. Your common bile duct may be examined. If stones are found in the common bile duct, they may be removed. After your gallbladder has been removed, the incisions will be closed with stitches (sutures), staples, or skin glue. Your incisions will be covered with a bandage (dressing). The procedure may vary among health care providers and hospitals. What happens after the procedure? Your blood pressure, heart rate, breathing rate, and blood oxygen level will be monitored until you leave the hospital or clinic. You will be given medicines as needed to control your pain. You may have a drain placed in the incision. The drain will be removed a day or two after the procedure. Summary Minimally invasive cholecystectomy, also called laparoscopic cholecystectomy, is surgery to remove the gallbladder using small  incisions. Tell your health care provider about all the medical conditions you have and all the medicines you are taking for those conditions. Before the procedure, follow instructions about when to stop eating and drinking and changing or stopping medicines. Plan to have a responsible adult care for you for the time you are told after you leave the hospital or clinic. This information is not intended to replace advice given to you by your health care provider. Make sure you discuss any questions you have with your health care provider. Document Revised: 06/05/2021 Document Reviewed: 06/05/2021 Elsevier Patient Education  2023 [employer]  

## 2022-07-29 ENCOUNTER — Telehealth: Payer: Self-pay | Admitting: Surgery

## 2022-07-29 NOTE — Telephone Encounter (Signed)
Outgoing call, left message for patient to call.  Please inform her of the following regarding her scheduled surgery:   Pre-Admission date/time, and Surgery date.  Surgery Date: 07/31/22 Preadmission Testing Date: 07/30/22 (phone 8a-1p)  Also patient will need to call at [phone], between 1-3:00pm the day before surgery, to find out what time to arrive for surgery.

## 2022-07-29 NOTE — Telephone Encounter (Signed)
Patient calls back, she is now informed of all dates regarding her surgery and verbalized understanding.

## 2022-07-30 ENCOUNTER — Other Ambulatory Visit: Payer: Self-pay

## 2022-07-30 ENCOUNTER — Encounter
Admission: RE | Admit: 2022-07-30 | Discharge: 2022-07-30 | Disposition: A | Discharge status: Home / Self Care | Payer: 59 | Source: Ambulatory Visit | Attending: Surgery | Admitting: Surgery

## 2022-07-30 VITALS — Ht 65.0 in | Wt 125.0 lb

## 2022-07-30 DIAGNOSIS — K8010 Calculus of gallbladder with chronic cholecystitis without obstruction: Secondary | ICD-10-CM

## 2022-07-30 MED ORDER — GABAPENTIN 300 MG PO CAPS: 300.0000 mg | ORAL_CAPSULE | ORAL | Status: AC

## 2022-07-30 MED ORDER — CHLORHEXIDINE GLUCONATE CLOTH 2 % EX PADS: 6.0000 | MEDICATED_PAD | Freq: Once | CUTANEOUS | Status: DC

## 2022-07-30 MED ORDER — ORAL CARE MOUTH RINSE: 15.0000 mL | Freq: Once | OROMUCOSAL | Status: AC

## 2022-07-30 MED ORDER — CHLORHEXIDINE GLUCONATE 0.12 % MT SOLN: 15.0000 mL | Freq: Once | OROMUCOSAL | Status: AC

## 2022-07-30 MED ORDER — BUPIVACAINE LIPOSOME 1.3 % IJ SUSP: 20.0000 mL | Freq: Once | INTRAMUSCULAR | Status: DC

## 2022-07-30 MED ORDER — CIPROFLOXACIN IN D5W 400 MG/200ML IV SOLN
400.0000 mg | INTRAVENOUS | Status: AC
  Administered 2022-07-31: 400 mg via INTRAVENOUS

## 2022-07-30 MED ORDER — INDOCYANINE GREEN 25 MG IV SOLR
1.2500 mg | Freq: Once | INTRAVENOUS | Status: AC
  Administered 2022-07-31: 1.25 mg via INTRAVENOUS
  Filled 2022-07-30: qty 0.5

## 2022-07-30 MED ORDER — LACTATED RINGERS IV SOLN: INTRAVENOUS | Status: DC

## 2022-07-30 MED ORDER — ACETAMINOPHEN 500 MG PO TABS: 1000.0000 mg | ORAL_TABLET | ORAL | Status: AC

## 2022-07-30 NOTE — Patient Instructions (Addendum)
Your procedure is scheduled on: tomorrow Report to Day Surgery.  Go to registration desk first on first floor  To find out your arrival time please call [phone] between 1PM - 3PM on today.  Remember: Instructions that are not followed completely may result in serious medical risk,  up to and including death, or upon the discretion of your surgeon and anesthesiologist your  surgery may need to be rescheduled.     _X__ 1. Do not eat food after midnight the night before your procedure.                 No chewing gum or hard candies. You may drink clear liquids up to 2 hours                 before you are scheduled to arrive for your surgery- DO not drink clear                 liquids within 2 hours of the start of your surgery.                 Clear Liquids include:  water, apple juice without pulp, clear Gatorade, G2 or                  Gatorade Zero (avoid Red/Purple/Blue), Black Coffee or Tea (Do not add                 anything to coffee or tea).  __X__2.  On the morning of surgery brush your teeth with toothpaste and water, you                may rinse your mouth with mouthwash if you wish.  Do not swallow any  toothpaste of mouthwash.     _X__ 3.  No Alcohol for 24 hours before or after surgery.   __ 4.  Do Not Smoke or use e-cigarettes For 24 Hours Prior to Your Surgery.                 Do not use any chewable tobacco products for at least 6 hours prior to                 Surgery.  __  5.  Do not use any recreational drugs (marijuana, cocaine, heroin, ecstasy,     MDMA or other) For at least one week prior to your surgery.    Combination of these drugs with anesthesia may have life threatening   results.  ____  6.  Bring all medications with you on the day of surgery if instructed.   __x__  7.  Notify your doctor if there is any change in your medical condition      (cold, fever, infections).     Do not wear jewelry, make-up, hairpins, clips or nail  polish. Do not wear lotions, powders, or perfumes.  Do not shave 48 hours prior to surgery.  Do not bring valuables to the hospital.    [facility] is not responsible for any belongings or valuables.  Contacts, dentures or bridgework may not be worn into surgery. Leave your suitcase in the car. After surgery it may be brought to your room. For patients admitted to the hospital, discharge time is determined by your treatment team.   Patients discharged the day of surgery will not be allowed to drive home.   Make arrangements for someone to be with you for the first 24 hours of your Same Day  Discharge.    ____ Take these medicines the morning of surgery with A SIP OF WATER:    1. none  2.   3.   4.  5.  6.  ____ Fleet Enema (as directed)   ___x_ shower tonight and tomorrow morning   ____ Use Benzoyl Peroxide Gel as instructed  ____ Use inhalers on the day of surgery  ____ Stop metformin 2 days prior to surgery    ____ Take 1/2 of usual insulin dose the night before surgery. No insulin the morning          of surgery.   ____ Stop Coumadin/Plavix/aspirin on   __x__ Stop Anti-inflammatories May take tylenol   ____ Stop supplements until after surgery.    ____ Bring C-Pap to the hospital.    If you have any questions regarding your pre-procedure instructions,  Please call Pre-admit Testing at [phone]

## 2022-07-31 ENCOUNTER — Other Ambulatory Visit: Payer: Self-pay

## 2022-07-31 ENCOUNTER — Ambulatory Visit: Payer: 59 | Admitting: Anesthesiology

## 2022-07-31 ENCOUNTER — Encounter
Admission: RE | Disposition: A | Discharge status: Home / Self Care | Payer: Self-pay | Source: Home / Self Care | Attending: Surgery

## 2022-07-31 ENCOUNTER — Ambulatory Visit
Admission: RE | Admit: 2022-07-31 | Discharge: 2022-07-31 | Disposition: A | Discharge status: Home / Self Care | Payer: 59 | Attending: Surgery | Admitting: Surgery

## 2022-07-31 ENCOUNTER — Encounter: Payer: Self-pay | Admitting: Surgery

## 2022-07-31 DIAGNOSIS — Z87891 Personal history of nicotine dependence: Secondary | ICD-10-CM | POA: Insufficient documentation

## 2022-07-31 DIAGNOSIS — K8010 Calculus of gallbladder with chronic cholecystitis without obstruction: Secondary | ICD-10-CM | POA: Diagnosis not present

## 2022-07-31 SURGERY — CHOLECYSTECTOMY, ROBOT-ASSISTED, LAPAROSCOPIC
Anesthesia: General | Site: Abdomen

## 2022-07-31 MED ORDER — ROCURONIUM BROMIDE 100 MG/10ML IV SOLN
INTRAVENOUS | Status: DC | PRN
  Administered 2022-07-31: 30 mg via INTRAVENOUS

## 2022-07-31 MED ORDER — BUPIVACAINE HCL (PF) 0.25 % IJ SOLN
INTRAMUSCULAR | Status: AC
  Filled 2022-07-31: qty 30

## 2022-07-31 MED ORDER — HYDROCODONE-ACETAMINOPHEN 5-325 MG PO TABS: 1.0000 | ORAL_TABLET | Freq: Four times a day (QID) | ORAL | 0 refills | Status: DC | PRN

## 2022-07-31 MED ORDER — PROPOFOL 1000 MG/100ML IV EMUL
INTRAVENOUS | Status: AC
  Filled 2022-07-31: qty 100

## 2022-07-31 MED ORDER — SCOPOLAMINE 1 MG/3DAYS TD PT72: 1.0000 | MEDICATED_PATCH | Freq: Once | TRANSDERMAL | Status: DC

## 2022-07-31 MED ORDER — ROCURONIUM BROMIDE 10 MG/ML (PF) SYRINGE
PREFILLED_SYRINGE | INTRAVENOUS | Status: AC
  Filled 2022-07-31: qty 10

## 2022-07-31 MED ORDER — GABAPENTIN 300 MG PO CAPS
ORAL_CAPSULE | ORAL | Status: AC
  Administered 2022-07-31: 300 mg via ORAL
  Filled 2022-07-31: qty 1

## 2022-07-31 MED ORDER — CIPROFLOXACIN IN D5W 400 MG/200ML IV SOLN
INTRAVENOUS | Status: AC
  Filled 2022-07-31: qty 200

## 2022-07-31 MED ORDER — PROPOFOL 10 MG/ML IV BOLUS
INTRAVENOUS | Status: DC | PRN
  Administered 2022-07-31: 50 mg via INTRAVENOUS
  Administered 2022-07-31: 150 mg via INTRAVENOUS

## 2022-07-31 MED ORDER — FENTANYL CITRATE (PF) 100 MCG/2ML IJ SOLN
INTRAMUSCULAR | Status: AC
  Filled 2022-07-31: qty 2

## 2022-07-31 MED ORDER — GABAPENTIN 300 MG PO CAPS: 300.0000 mg | ORAL_CAPSULE | Freq: Once | ORAL | Status: AC

## 2022-07-31 MED ORDER — ONDANSETRON HCL 4 MG/2ML IJ SOLN
INTRAMUSCULAR | Status: DC | PRN
  Administered 2022-07-31: 4 mg via INTRAVENOUS

## 2022-07-31 MED ORDER — PROPOFOL 10 MG/ML IV BOLUS
INTRAVENOUS | Status: AC
  Filled 2022-07-31: qty 20

## 2022-07-31 MED ORDER — SCOPOLAMINE 1 MG/3DAYS TD PT72
MEDICATED_PATCH | TRANSDERMAL | Status: AC
  Administered 2022-07-31: 1.5 mg via TRANSDERMAL
  Filled 2022-07-31: qty 1

## 2022-07-31 MED ORDER — SUGAMMADEX SODIUM 200 MG/2ML IV SOLN
INTRAVENOUS | Status: DC | PRN
  Administered 2022-07-31: 200 mg via INTRAVENOUS

## 2022-07-31 MED ORDER — ONDANSETRON HCL 4 MG/2ML IJ SOLN
INTRAMUSCULAR | Status: AC
  Filled 2022-07-31: qty 2

## 2022-07-31 MED ORDER — DEXAMETHASONE SODIUM PHOSPHATE 10 MG/ML IJ SOLN
INTRAMUSCULAR | Status: AC
  Filled 2022-07-31: qty 1

## 2022-07-31 MED ORDER — OXYCODONE HCL 5 MG PO TABS: 5.0000 mg | ORAL_TABLET | Freq: Once | ORAL | Status: AC | PRN

## 2022-07-31 MED ORDER — MIDAZOLAM HCL 2 MG/2ML IJ SOLN
INTRAMUSCULAR | Status: AC
  Filled 2022-07-31: qty 2

## 2022-07-31 MED ORDER — ACETAMINOPHEN 500 MG PO TABS: 1000.0000 mg | ORAL_TABLET | Freq: Once | ORAL | Status: AC

## 2022-07-31 MED ORDER — BUPIVACAINE LIPOSOME 1.3 % IJ SUSP
INTRAMUSCULAR | Status: DC | PRN
  Administered 2022-07-31: 38 mL

## 2022-07-31 MED ORDER — ACETAMINOPHEN 500 MG PO TABS
ORAL_TABLET | ORAL | Status: AC
  Administered 2022-07-31: 1000 mg via ORAL
  Filled 2022-07-31: qty 2

## 2022-07-31 MED ORDER — PROMETHAZINE HCL 25 MG/ML IJ SOLN: 6.2500 mg | INTRAMUSCULAR | Status: DC | PRN

## 2022-07-31 MED ORDER — CHLORHEXIDINE GLUCONATE 0.12 % MT SOLN
OROMUCOSAL | Status: AC
  Administered 2022-07-31: 15 mL via OROMUCOSAL
  Filled 2022-07-31: qty 15

## 2022-07-31 MED ORDER — BUPIVACAINE LIPOSOME 1.3 % IJ SUSP
INTRAMUSCULAR | Status: AC
  Filled 2022-07-31: qty 20

## 2022-07-31 MED ORDER — KETOROLAC TROMETHAMINE 30 MG/ML IJ SOLN
INTRAMUSCULAR | Status: AC
  Filled 2022-07-31: qty 1

## 2022-07-31 MED ORDER — PHENYLEPHRINE HCL-NACL 20-0.9 MG/250ML-% IV SOLN
INTRAVENOUS | Status: DC | PRN
  Administered 2022-07-31: 40 ug/min via INTRAVENOUS

## 2022-07-31 MED ORDER — ACETAMINOPHEN 10 MG/ML IV SOLN: 1000.0000 mg | Freq: Once | INTRAVENOUS | Status: DC | PRN

## 2022-07-31 MED ORDER — DROPERIDOL 2.5 MG/ML IJ SOLN: 0.6250 mg | Freq: Once | INTRAMUSCULAR | Status: DC | PRN

## 2022-07-31 MED ORDER — MIDAZOLAM HCL 5 MG/5ML IJ SOLN
INTRAMUSCULAR | Status: DC | PRN
  Administered 2022-07-31 (×2): 1 mg via INTRAVENOUS

## 2022-07-31 MED ORDER — 0.9 % SODIUM CHLORIDE (POUR BTL) OPTIME
TOPICAL | Status: DC | PRN
  Administered 2022-07-31: 500 mL

## 2022-07-31 MED ORDER — FENTANYL CITRATE (PF) 100 MCG/2ML IJ SOLN
25.0000 ug | INTRAMUSCULAR | Status: DC | PRN
  Administered 2022-07-31: 25 ug via INTRAVENOUS

## 2022-07-31 MED ORDER — OXYCODONE HCL 5 MG/5ML PO SOLN: 5.0000 mg | Freq: Once | ORAL | Status: AC | PRN

## 2022-07-31 MED ORDER — SUCCINYLCHOLINE CHLORIDE 200 MG/10ML IV SOSY
PREFILLED_SYRINGE | INTRAVENOUS | Status: DC | PRN
  Administered 2022-07-31: 100 mg via INTRAVENOUS

## 2022-07-31 MED ORDER — KETOROLAC TROMETHAMINE 30 MG/ML IJ SOLN
INTRAMUSCULAR | Status: DC | PRN
  Administered 2022-07-31: 30 mg via INTRAVENOUS

## 2022-07-31 MED ORDER — PROPOFOL 500 MG/50ML IV EMUL
INTRAVENOUS | Status: DC | PRN
  Administered 2022-07-31: 165 ug/kg/min via INTRAVENOUS

## 2022-07-31 MED ORDER — OXYCODONE HCL 5 MG PO TABS
ORAL_TABLET | ORAL | Status: AC
  Administered 2022-07-31: 5 mg via ORAL
  Filled 2022-07-31: qty 1

## 2022-07-31 MED ORDER — FENTANYL CITRATE (PF) 100 MCG/2ML IJ SOLN
INTRAMUSCULAR | Status: AC
  Administered 2022-07-31: 25 ug via INTRAVENOUS
  Filled 2022-07-31: qty 2

## 2022-07-31 MED ORDER — LIDOCAINE HCL (CARDIAC) PF 100 MG/5ML IV SOSY
PREFILLED_SYRINGE | INTRAVENOUS | Status: DC | PRN
  Administered 2022-07-31: 40 mg via INTRAVENOUS
  Administered 2022-07-31: 60 mg via INTRAVENOUS

## 2022-07-31 MED ORDER — DEXAMETHASONE SODIUM PHOSPHATE 10 MG/ML IJ SOLN
INTRAMUSCULAR | Status: DC | PRN
  Administered 2022-07-31: 10 mg via INTRAVENOUS

## 2022-07-31 MED ORDER — LIDOCAINE HCL (PF) 2 % IJ SOLN
INTRAMUSCULAR | Status: AC
  Filled 2022-07-31: qty 5

## 2022-07-31 MED ORDER — FENTANYL CITRATE (PF) 100 MCG/2ML IJ SOLN
INTRAMUSCULAR | Status: DC | PRN
  Administered 2022-07-31 (×2): 50 ug via INTRAVENOUS

## 2022-07-31 SURGICAL SUPPLY — 47 items
ADH SKN CLS APL DERMABOND .7 (GAUZE/BANDAGES/DRESSINGS) ×1
BAG PRESSURE INF REUSE 3000 (BAG) IMPLANT
CLIP LIGATING HEM O LOK PURPLE (MISCELLANEOUS) ×3 IMPLANT
COVER TIP SHEARS 8 DVNC (MISCELLANEOUS) ×1 IMPLANT
COVER TIP SHEARS 8MM DA VINCI (MISCELLANEOUS) ×1
DERMABOND ADVANCED (GAUZE/BANDAGES/DRESSINGS) ×1
DERMABOND ADVANCED .7 DNX12 (GAUZE/BANDAGES/DRESSINGS) ×1 IMPLANT
DRAPE ARM DVNC X/XI (DISPOSABLE) ×4 IMPLANT
DRAPE COLUMN DVNC XI (DISPOSABLE) ×1 IMPLANT
DRAPE DA VINCI XI ARM (DISPOSABLE) ×4
DRAPE DA VINCI XI COLUMN (DISPOSABLE) ×1
ELECT CAUTERY BLADE 6.4 (BLADE) ×2 IMPLANT
GLOVE ORTHO TXT STRL SZ7.5 (GLOVE) ×4 IMPLANT
GOWN STRL REUS W/ TWL LRG LVL3 (GOWN DISPOSABLE) ×2 IMPLANT
GOWN STRL REUS W/ TWL XL LVL3 (GOWN DISPOSABLE) ×2 IMPLANT
GOWN STRL REUS W/TWL LRG LVL3 (GOWN DISPOSABLE) ×4
GOWN STRL REUS W/TWL XL LVL3 (GOWN DISPOSABLE) ×4
GRASPER SUT TROCAR 14GX15 (MISCELLANEOUS) IMPLANT
IRRIGATION STRYKERFLOW (MISCELLANEOUS) IMPLANT
IRRIGATOR STRYKERFLOW (MISCELLANEOUS)
IRRIGATOR SUCT 8 DISP DVNC XI (IRRIGATION / IRRIGATOR) IMPLANT
IRRIGATOR SUCTION 8MM XI DISP (IRRIGATION / IRRIGATOR)
IV NS IRRIG 3000ML ARTHROMATIC (IV SOLUTION) IMPLANT
KIT PINK PAD W/HEAD ARE REST (MISCELLANEOUS) ×2
KIT PINK PAD W/HEAD ARM REST (MISCELLANEOUS) ×1 IMPLANT
KIT TURNOVER KIT A (KITS) ×2 IMPLANT
LABEL OR SOLS (LABEL) ×2 IMPLANT
MANIFOLD NEPTUNE II (INSTRUMENTS) ×2 IMPLANT
NDL INSUFFLATION 14GA 120MM (NEEDLE) IMPLANT
NEEDLE HYPO 22GX1.5 SAFETY (NEEDLE) ×2 IMPLANT
NEEDLE INSUFFLATION 14GA 120MM (NEEDLE) IMPLANT
NS IRRIG 500ML POUR BTL (IV SOLUTION) ×2 IMPLANT
PACK LAP CHOLECYSTECTOMY (MISCELLANEOUS) ×2 IMPLANT
SEAL CANN UNIV 5-8 DVNC XI (MISCELLANEOUS) ×4 IMPLANT
SEAL XI 5MM-8MM UNIVERSAL (MISCELLANEOUS) ×4
SET TUBE SMOKE EVAC HIGH FLOW (TUBING) ×2 IMPLANT
SOLUTION ELECTROLUBE (MISCELLANEOUS) ×2 IMPLANT
SPIKE FLUID TRANSFER (MISCELLANEOUS) ×2 IMPLANT
SUT MNCRL 4-0 (SUTURE) ×2
SUT MNCRL 4-0 27XMFL (SUTURE) ×1
SUT VICRYL 0 AB UR-6 (SUTURE) ×2 IMPLANT
SUTURE MNCRL 4-0 27XMF (SUTURE) ×1 IMPLANT
SYS BAG RETRIEVAL 10MM (BASKET) ×2
SYSTEM BAG RETRIEVAL 10MM (BASKET) ×1 IMPLANT
TRAP FLUID SMOKE EVACUATOR (MISCELLANEOUS) ×2 IMPLANT
TROCAR Z-THREAD FIOS 11X100 BL (TROCAR) IMPLANT
WATER STERILE IRR 500ML POUR (IV SOLUTION) ×2 IMPLANT

## 2022-07-31 NOTE — Anesthesia Preprocedure Evaluation (Addendum)
Anesthesia Evaluation  Patient identified by MRN, date of birth, ID band Patient awake    Reviewed: Allergy & Precautions, H&P , NPO status , Patient's Chart, lab work & pertinent test results  History of Anesthesia Complications (+) PONV and history of anesthetic complications  Airway Mallampati: II  TM Distance: >3 FB Neck ROM: full    Dental no notable dental hx.    Pulmonary former smoker,    Pulmonary exam normal breath sounds clear to auscultation       Cardiovascular Normal cardiovascular exam Rhythm:regular Rate:Normal     Neuro/Psych  Headaches, PSYCHIATRIC DISORDERS Anxiety DDD (degenerative disc disease), cervical  Brain tumor 2011 s/p removal  Hearing impairment    GI/Hepatic Chronic calculous cholecystitis    Endo/Other    Renal/GU      Musculoskeletal  (+) Arthritis ,   Abdominal Normal abdominal exam  (+)   Peds  Hematology   Anesthesia Other Findings   Reproductive/Obstetrics                            Anesthesia Physical  Anesthesia Plan  ASA: 2  Anesthesia Plan: General   Post-op Pain Management: Regional block*, Tylenol PO (pre-op)* and Gabapentin PO (pre-op)*   Induction: Intravenous  PONV Risk Score and Plan: 4 or greater and TIVA, Propofol infusion, Ondansetron, Dexamethasone and Scopolamine patch - Pre-op  Airway Management Planned: Oral ETT  Additional Equipment:   Intra-op Plan:   Post-operative Plan: Extubation in OR  Informed Consent: I have reviewed the patients History and Physical, chart, labs and discussed the procedure including the risks, benefits and alternatives for the proposed anesthesia with the patient or authorized representative who has indicated his/her understanding and acceptance.       Plan Discussed with: CRNA and Anesthesiologist  Anesthesia Plan Comments:       Anesthesia Quick Evaluation

## 2022-07-31 NOTE — Anesthesia Postprocedure Evaluation (Signed)
Anesthesia Post Note  Patient: Elizabeth Peters  Procedure(s) Performed: XI ROBOTIC ASSISTED LAPAROSCOPIC CHOLECYSTECTOMY (Abdomen) INDOCYANINE GREEN FLUORESCENCE IMAGING (ICG) (Abdomen)  Patient location during evaluation: PACU Anesthesia Type: General Level of consciousness: awake and alert Pain management: pain level controlled Vital Signs Assessment: post-procedure vital signs reviewed and stable Respiratory status: spontaneous breathing, nonlabored ventilation, respiratory function stable and patient connected to nasal cannula oxygen Cardiovascular status: blood pressure returned to baseline and stable Postop Assessment: no apparent nausea or vomiting Anesthetic complications: no   No notable events documented.   Last Vitals:  Vitals:   07/31/22 1646 07/31/22 1701  BP: 111/77   Pulse: (!) 47 (!) 54  Resp: 15   Temp: 36.9 C   SpO2: 100%     Last Pain:  Vitals:   07/31/22 1646  TempSrc: Temporal  PainSc: 4                  Martha Clan

## 2022-07-31 NOTE — Op Note (Signed)
Robotic cholecystectomy with Indocyamine Green Ductal Imaging.   Pre-operative Diagnosis: Chronic calculus cholecystitis  Post-operative Diagnosis:  Same.  Procedure: Robotic assisted laparoscopic cholecystectomy with Indocyamine Green Ductal Imaging.   Surgeon: Ronny Bacon, M.D., FACS  Anesthesia: General. with endotracheal tube  Findings: Minimal adhesions, multiple gallstones.  Estimated Blood Loss: 15 mL         Drains: None         Specimens: Gallbladder           Complications: none  Procedure Details  The patient was seen again in the Holding Room.  2.5 mg dose of ICG was administered intravenously.   The benefits, complications, treatment options, risks and expected outcomes were again reviewed with the patient. The likelihood of improving the patient's symptoms with return to their baseline status is good.  The patient and/or family concurred with the proposed plan, giving informed consent, again alternatives reviewed.  The patient was taken to Operating Room, identified, and the procedure verified as robotic assisted laparoscopic cholecystectomy.  Prior to the induction of general anesthesia, antibiotic prophylaxis was administered. VTE prophylaxis was in place. General endotracheal anesthesia was then administered and tolerated well. The patient was positioned in the supine position.  After the induction, the abdomen was prepped with Chloraprep and draped in the sterile fashion.  A Time Out was held and the above information confirmed.  After local infiltration of quarter percent Marcaine with epinephrine, stab incision was made left upper quadrant.  Just below the costal margin at Palmer's point, approximately midclavicular line the Veres needle is passed with sensation of the layers to penetrate the abdominal wall and into the peritoneum.  Saline drop test is confirmed peritoneal placement.  Insufflation is initiated with carbon dioxide to pressures of 15 mmHg.  Right  infra-umbilical local infiltration with quarter percent Marcaine with epinephrine is utilized.  Made a 12 mm incision on the right periumbilical site, I advanced an optical 95m port under direct visualization into the peritoneal cavity.  Once the peritoneum was penetrated, insufflation was initiated.  The trocar was then advanced into the abdominal cavity under direct visualization. Pneumoperitoneum was then continued with Air seal utilizing CO2 at 15 mmHg or less and tolerated well without any adverse changes in the patient's vital signs.  Two 8.5-mm ports were placed in the left lower quadrant and laterally, and one to the right lower quadrant, all under direct vision. All skin incisions  were infiltrated with a local anesthetic agent before making the incision and placing the trocars.  The patient was positioned  in reverse Trendelenburg, tilted the patient's left side down.  Da Vinci XI robot was then positioned on to the patient's left side, and docked.  The gallbladder was identified, the fundus grasped via the arm 4 Prograsp and retracted cephalad. Adhesions were lysed with scissors and cautery.  The infundibulum was identified grasped and retracted laterally, exposing the peritoneum overlying the triangle of Calot. This was then opened and dissected using cautery & scissors. An extended critical view of the cystic duct and cystic artery was obtained, aided by the ICG via FireFly which enabled ready visualization of the ductal anatomy.    The cystic duct was clearly identified and dissected to isolation.   Artery well isolated and clipped, and the cystic duct was triple clipped and divided with scissors, as close to the gallbladder neck as feasible, thus leaving two on the remaining stump.  The specimen side of the artery is sealed with bipolar and divided  with monopolar scissors.   The gallbladder was taken from the gallbladder fossa in a retrograde fashion with the electrocautery. The gallbladder  was removed and placed in an Endocatch bag.  The liver bed is inspected. Hemostasis was confirmed.  The robot was undocked and moved away from the operative field. Some irrigation was utilized and was aspirated clear.  The gallbladder and Endocatch sac were then removed through the infraumbilical port site.   Inspection of the right upper quadrant was performed. No bleeding, bile duct injury or leak, or bowel injury was noted. The infra-umbilical port site fascia was closed with interrumpted 0 Vicryl sutures using PMI/cone under direct visualization. Pneumoperitoneum was released and ports removed.  4-0 subcuticular Monocryl was used to close the skin. Dermabond was  applied.  The patient was then extubated and brought to the recovery room in stable condition. Sponge, lap, and needle counts were correct at closure and at the conclusion of the case.               Ronny Bacon, M.D., [facility] 07/31/2022 4:05 PM

## 2022-07-31 NOTE — Discharge Instructions (Signed)
AMBULATORY SURGERY  DISCHARGE INSTRUCTIONS   The drugs that you were given will stay in your system until tomorrow so for the next 24 hours you should not:  Drive an automobile Make any legal decisions Drink any alcoholic beverage   You may resume regular meals tomorrow.  Today it is better to start with liquids and gradually work up to solid foods.  You may eat anything you prefer, but it is better to start with liquids, then soup and crackers, and gradually work up to solid foods.   Please notify your doctor immediately if you have any unusual bleeding, trouble breathing, redness and pain at the surgery site, drainage, fever, or pain not relieved by medication.    Additional Instructions:   Please contact your physician with any problems or Same Day Surgery at [phone], Monday through Friday 6 am to 4 pm, or [place] at [facility] number at [phone].     DO NOT REMOVE GREEN TEAL BAND FOR 4 DAYS

## 2022-07-31 NOTE — Anesthesia Procedure Notes (Signed)
Procedure Name: Intubation Date/Time: 07/31/2022 2:34 PM  Performed by: Loletha Grayer, CRNAPre-anesthesia Checklist: Patient identified, Patient being monitored, Timeout performed, Emergency Drugs available and Suction available Patient Re-evaluated:Patient Re-evaluated prior to induction Oxygen Delivery Method: Circle system utilized Preoxygenation: Pre-oxygenation with 100% oxygen Induction Type: IV induction and Rapid sequence Laryngoscope Size: 3 and McGraph Grade View: Grade I Tube type: Oral Tube size: 7.0 mm Number of attempts: 1 Airway Equipment and Method: Stylet Placement Confirmation: ETT inserted through vocal cords under direct vision, positive ETCO2 and breath sounds checked- equal and bilateral Secured at: 20 cm Tube secured with: Tape Dental Injury: Teeth and Oropharynx as per pre-operative assessment

## 2022-07-31 NOTE — Interval H&P Note (Signed)
History and Physical Interval Note:  07/31/2022 2:10 PM  Elizabeth Peters  has presented today for surgery, with the diagnosis of Chronic calculous cholecystitis K80.10.  The various methods of treatment have been discussed with the patient and family. After consideration of risks, benefits and other options for treatment, the patient has consented to  Procedure(s): XI ROBOTIC ASSISTED LAPAROSCOPIC CHOLECYSTECTOMY (N/A) Williams (ICG) (N/A) as a surgical intervention.  The patient's history has been reviewed, patient examined, no change in status, stable for surgery.  I have reviewed the patient's chart and labs.  Questions were answered to the patient's satisfaction.     Ronny Bacon

## 2022-07-31 NOTE — Transfer of Care (Signed)
Immediate Anesthesia Transfer of Care Note  Patient: Elizabeth Peters  Procedure(s) Performed: XI ROBOTIC ASSISTED LAPAROSCOPIC CHOLECYSTECTOMY (Abdomen) INDOCYANINE GREEN FLUORESCENCE IMAGING (ICG) (Abdomen)  Patient Location: PACU  Anesthesia Type:General  Level of Consciousness: awake and drowsy  Airway & Oxygen Therapy: Patient Spontanous Breathing  Post-op Assessment: Report given to RN and Post -op Vital signs reviewed and stable  Post vital signs: Reviewed and stable  Last Vitals:  Vitals Value Taken Time  BP 134/62 07/31/22 1555  Temp    Pulse 46 07/31/22 1555  Resp 18 07/31/22 1555  SpO2 100 % 07/31/22 1555    Last Pain:  Vitals:   07/31/22 1259  TempSrc: Temporal  PainSc: 5          Complications: No notable events documented.

## 2022-08-02 LAB — SURGICAL PATHOLOGY

## 2022-08-07 ENCOUNTER — Ambulatory Visit: Payer: BLUE CROSS/BLUE SHIELD | Admitting: Family Medicine

## 2022-08-09 ENCOUNTER — Other Ambulatory Visit
Admission: RE | Admit: 2022-08-09 | Discharge: 2022-08-09 | Disposition: A | Discharge status: Home / Self Care | Payer: 59 | Attending: Surgery | Admitting: Surgery

## 2022-08-09 ENCOUNTER — Telehealth: Payer: Self-pay

## 2022-08-09 ENCOUNTER — Other Ambulatory Visit: Payer: Self-pay | Admitting: Surgery

## 2022-08-09 DIAGNOSIS — Z9049 Acquired absence of other specified parts of digestive tract: Secondary | ICD-10-CM

## 2022-08-09 LAB — COMPREHENSIVE METABOLIC PANEL
ALT: 30 U/L (ref 0–44)
AST: 19 U/L (ref 15–41)
Albumin: 3.9 g/dL (ref 3.5–5.0)
Alkaline Phosphatase: 47 U/L (ref 38–126)
Anion gap: 5 (ref 5–15)
BUN: 9 mg/dL (ref 6–20)
CO2: 29 mmol/L (ref 22–32)
Calcium: 9.3 mg/dL (ref 8.9–10.3)
Chloride: 107 mmol/L (ref 98–111)
Creatinine, Ser: 0.74 mg/dL (ref 0.44–1.00)
GFR, Estimated: >60 mL/min (ref >60)
Glucose, Bld: 116 mg/dL — ABNORMAL HIGH (ref 70–99)
Potassium: 3.9 mmol/L (ref 3.5–5.1)
Sodium: 141 mmol/L (ref 135–145)
Total Bilirubin: 0.8 mg/dL (ref 0.3–1.2)
Total Protein: 6.7 g/dL (ref 6.5–8.1)

## 2022-08-09 LAB — CBC WITH DIFFERENTIAL/PLATELET
Abs Immature Granulocytes: 0.02 10*3/uL (ref 0.00–0.07)
Basophils Absolute: 0 10*3/uL (ref 0.0–0.1)
Basophils Relative: 0 %
Eosinophils Absolute: 0 10*3/uL (ref 0.0–0.5)
Eosinophils Relative: 0 %
HCT: 34.5 % — ABNORMAL LOW (ref 36.0–46.0)
Hemoglobin: 11.8 g/dL — ABNORMAL LOW (ref 12.0–15.0)
Immature Granulocytes: 0 %
Lymphocytes Relative: 25 %
Lymphs Abs: 1.7 10*3/uL (ref 0.7–4.0)
MCH: 29.2 pg (ref 26.0–34.0)
MCHC: 34.2 g/dL (ref 30.0–36.0)
MCV: 85.4 fL (ref 80.0–100.0)
Monocytes Absolute: 0.3 10*3/uL (ref 0.1–1.0)
Monocytes Relative: 4 %
Neutro Abs: 4.9 10*3/uL (ref 1.7–7.7)
Neutrophils Relative %: 71 %
Platelets: 214 10*3/uL (ref 150–400)
RBC: 4.04 MIL/uL (ref 3.87–5.11)
RDW: 12.1 % (ref 11.5–15.5)
WBC: 7 10*3/uL (ref 4.0–10.5)
nRBC: 0 % (ref 0.0–0.2)

## 2022-08-09 MED ORDER — ONDANSETRON HCL 4 MG PO TABS: 4.0000 mg | ORAL_TABLET | Freq: Three times a day (TID) | ORAL | 0 refills | Status: DC | PRN

## 2022-08-09 NOTE — Telephone Encounter (Signed)
Patient called and had gallbladder surgery with Dr Christian Mate on 07/31/22. She reports continued right upper abdominal pain since surgery. Yesterday and today she reports nausea and has vomited bile today. Message sent to Dr Christian Mate and he attempted to call the patient but her phone did go straight to voicemail. I also attempted to call the patient and the number also went to voicemail.

## 2022-08-15 ENCOUNTER — Ambulatory Visit (INDEPENDENT_AMBULATORY_CARE_PROVIDER_SITE_OTHER): Payer: 59 | Admitting: Physician Assistant

## 2022-08-15 ENCOUNTER — Encounter: Payer: Self-pay | Admitting: Physician Assistant

## 2022-08-15 VITALS — BP 124/76 | HR 72 | Temp 98.0°F | Ht 65.0 in | Wt 128.0 lb

## 2022-08-15 DIAGNOSIS — Z9049 Acquired absence of other specified parts of digestive tract: Secondary | ICD-10-CM

## 2022-08-15 DIAGNOSIS — K8010 Calculus of gallbladder with chronic cholecystitis without obstruction: Secondary | ICD-10-CM

## 2022-08-15 DIAGNOSIS — Z09 Encounter for follow-up examination after completed treatment for conditions other than malignant neoplasm: Secondary | ICD-10-CM

## 2022-08-15 NOTE — Patient Instructions (Signed)

## 2022-08-15 NOTE — Progress Notes (Signed)
[facility] POST-OP OFFICE VISIT  08/15/2022  HPI: Elizabeth Peters is a 41 y.o. female 15 days s/p robotic assisted laparoscopic cholecystectomy for [facility] with Dr Christian Mate   She had continued right sided pain and called the office on 08/25 after an episode of nausea/emesis. She had labs (CBC/CMP) checked which were reassuring. Unfortunately, it appears her previous symptoms have continued to persist. She continues to have intractable nausea, emesis, and diarrhea. She described both her diarrhea and emesis as green and what sounds like mucus. Additionally, she continues to have green discoloration in her urine. She denied any incisional soreness but she continues to have pain in her epigastrium, LLQ, and into the pelvis especially with eating. She endorses a strong appetite but is unable to keep any PO intake down, and if she does, she reports very frequent diarrhea. She denied trying any medication for the diarrhea. On chart review, it appears she has had extensive work up including CT, [place], upper and lower endoscopy, and numerous laboratory tests without any obvious answer for her symptoms.   Vital signs: BP 124/76   Pulse 72   Temp 98 F (36.7 C)   Ht '5\' 5"'$  (1.651 m)   Wt 128 lb (58.1 kg)   LMP 12/04/2021 (Exact Date)   SpO2 97%   BMI 21.30 kg/m    Physical Exam: Constitutional: Well appearing female, NAD Abdomen: Soft, non-tender, non-distended, no rebound/guarding Skin: Laparoscopic incisions are healing well, no erythema or drainage   Assessment/Plan: This is a 41 y.o. female 15 days s/p robotic assisted laparoscopic cholecystectomy for [facility] with Dr Christian Mate    - I was very candid with the patient and her significant other that I am not sure I will have anything additional to offer at this point. Unfortunately, it appears her gallbladder was not the culprit of her symptoms and may not have been contributing at all. She has had a very extensive work up for this, which I  again reviewed today. She has not tried any medication at all for her diarrhea, and I did recommend OTC imodium in an effort to try and give her some relief. Additionally, I recommended continuation of nutritional supplement (ie: Ensure) as tolerated. I vaguely talked about potential for artifical nutritional sources (ie: TPN) but will defer this to PCP as this is not something I manage outside the hospital. Again, I do not think there is any complication from a cholecystectomy standpoint. She has recent labs on 08/25 which are grossly reassuring.   - Reviewed lifting restrictions; 4 weeks total  - Reviewed surgical pathology; [place]  - I do not have much else to offer currently, We will be happy to follow her on an as needed basis; She understands to call with questions/concerns  -- Edison Simon, PA-C [facility] 08/15/2022, 2:05 PM M-F: 7am - 4pm

## 2022-08-19 ENCOUNTER — Encounter: Payer: Self-pay | Admitting: Family Medicine

## 2022-08-19 DIAGNOSIS — M2550 Pain in unspecified joint: Secondary | ICD-10-CM

## 2022-08-19 DIAGNOSIS — G8929 Other chronic pain: Secondary | ICD-10-CM

## 2022-08-23 ENCOUNTER — Encounter: Payer: Self-pay | Admitting: Family Medicine

## 2022-08-26 ENCOUNTER — Encounter: Payer: Self-pay | Admitting: Family Medicine

## 2022-08-27 ENCOUNTER — Encounter: Payer: Self-pay | Admitting: Nurse Practitioner

## 2022-08-27 ENCOUNTER — Telehealth: Payer: Self-pay | Admitting: Family Medicine

## 2022-08-27 ENCOUNTER — Ambulatory Visit (INDEPENDENT_AMBULATORY_CARE_PROVIDER_SITE_OTHER): Payer: 59 | Admitting: Nurse Practitioner

## 2022-08-27 VITALS — BP 128/88 | HR 76 | Temp 98.2°F | Resp 18 | Ht 65.0 in | Wt 127.6 lb

## 2022-08-27 DIAGNOSIS — G8929 Other chronic pain: Secondary | ICD-10-CM

## 2022-08-27 DIAGNOSIS — N644 Mastodynia: Secondary | ICD-10-CM | POA: Diagnosis not present

## 2022-08-27 DIAGNOSIS — N760 Acute vaginitis: Secondary | ICD-10-CM | POA: Diagnosis not present

## 2022-08-27 DIAGNOSIS — M5440 Lumbago with sciatica, unspecified side: Secondary | ICD-10-CM | POA: Diagnosis not present

## 2022-08-27 DIAGNOSIS — B9689 Other specified bacterial agents as the cause of diseases classified elsewhere: Secondary | ICD-10-CM | POA: Insufficient documentation

## 2022-08-27 DIAGNOSIS — R234 Changes in skin texture: Secondary | ICD-10-CM | POA: Insufficient documentation

## 2022-08-27 LAB — WET PREP FOR TRICH, YEAST, CLUE
Clue Cell Exam: POSITIVE — AB
Trichomonas Exam: NEGATIVE
Yeast Exam: NEGATIVE

## 2022-08-27 LAB — URINALYSIS, ROUTINE W REFLEX MICROSCOPIC
Bilirubin, UA: NEGATIVE
Glucose, UA: NEGATIVE
Ketones, UA: NEGATIVE
Leukocytes,UA: NEGATIVE
Nitrite, UA: NEGATIVE
Protein,UA: NEGATIVE
RBC, UA: NEGATIVE
Specific Gravity, UA: 1.01 (ref 1.005–1.030)
Urobilinogen, Ur: 0.2 mg/dL (ref 0.2–1.0)
pH, UA: 6 (ref 5.0–7.5)

## 2022-08-27 MED ORDER — METRONIDAZOLE 500 MG PO TABS: 500.0000 mg | ORAL_TABLET | Freq: Two times a day (BID) | ORAL | 0 refills | Status: AC

## 2022-08-27 NOTE — Telephone Encounter (Signed)
Copied from [place] [phone]. Topic: General - Other >> Aug 26, 2022  4:23 PM Everette C wrote: Reason for CRM: The patient has called to request orders for a mammogram   The patient has noticed a 4 inch area of concern on their left breast and would like further imaging   The patient will see Manley Mason tomorrow 08/27/22 for their left breast concern

## 2022-08-27 NOTE — Progress Notes (Addendum)
BP 128/88 (BP Location: Left Arm)   Pulse 76   Temp 98.2 F (36.8 C) (Oral)   Resp 18   Ht '5\' 5"'$  (1.651 m)   Wt 127 lb 9.6 oz (57.9 kg)   LMP 12/04/2021 (Exact Date)   SpO2 99%   BMI 21.23 kg/m    Subjective:    Patient ID: Elizabeth Peters, female    DOB: [birth year], 41 y.o.   MRN: [id number]  HPI: Elizabeth Peters is a 41 y.o. female  Chief Complaint  Patient presents with   Breast Problem    Patient says she has a "red mark" on her L breast and it has been there for about 4-5 days. Patient says her underarm is tender and her sternum is painful. Patient says she has been cleaning the area with Dial soap and Neosporin. Patient says she has been taking Ibuprofen and Tylenol.    Flank Pain    Patient says she recently had her gallbladder removed and she spoke with surgeon. Patient says her pain is now worse than her before surgery. Patient says the pain radiate from mid back around to her pelvic area to her urethra. Patient says her urine has been a deep green color from a while and denies having urinary symptoms. Patient says she has noticed that she will have burning after urination about 20 minutes later.    BREAST PAIN Has had a red mark to left breast 4-5 days ago, started cleaning area and putting on neosporin.  Has tenderness under arm present. Has not had a mammogram since 2018.   Duration :days Location: right Onset: gradual Severity: 4/10 Quality: dull and aching -- like bruise when hits it Frequency: intermittent Redness: yes Swelling: no Trauma: no trauma Breastfeeding: no Associated with menstral cycle: no Nipple discharge: no Breast lump: no Status: stable Treatments attempted: soap and neosporin Previous mammogram:  2018    URINARY SYMPTOMS Had gall bladder removed on 07/31/21 and reports since that time she has been having flank pain.  Currently urine is occasionally green (this has been ongoing since before surgery -- since May 2023) and orange/brown or  clear.  Pain radiates from mid back to urethra area.  She reports her current diet is Boost and water and Gatorade.  Did see GI back in May 2023.  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   Dysuria: burning for 20 minutes after Urinary frequency: no Urgency: no Small volume voids: no Symptom severity: no Urinary incontinence: no Foul odor: no Hematuria: no Abdominal pain: no Back pain: yes Suprapubic pain/pressure: no Flank pain: yes Fever:  no Vomiting: yes, has had for a year Status: stable Previous urinary tract infection: yes Recurrent urinary tract infection: no Sexual activity: monogamous History of sexually transmitted disease: no Treatments attempted: Tylenol and Ibuprofen    Relevant past medical, surgical, family and social history reviewed and updated as indicated. Interim medical history since our last visit reviewed. Allergies and medications reviewed and updated.  Review of Systems  Constitutional:  Negative for activity change, appetite change, diaphoresis, fatigue and fever.  Respiratory:  Negative for cough, chest tightness and shortness of breath.   Cardiovascular:  Negative for chest pain, palpitations and leg swelling.  Gastrointestinal: Negative.   Neurological: Negative.   Psychiatric/Behavioral: Negative.      Per HPI unless specifically indicated above     Objective:  BP 128/88 (BP Location: Left Arm)   Pulse 76   Temp 98.2 F (36.8 C) (Oral)   Resp 18   Ht '5\' 5"'$  (1.651 m)   Wt 127 lb 9.6 oz (57.9 kg)   LMP 12/04/2021 (Exact Date)   SpO2 99%   BMI 21.23 kg/m   Wt Readings from Last 3 Encounters:  08/27/22 127 lb 9.6 oz (57.9 kg)  08/15/22 128 lb (58.1 kg)  07/31/22 129 lb (58.5 kg)    Physical Exam Vitals and nursing note reviewed. Exam conducted with a chaperone present.  Constitutional:      General: She is awake.  She is not in acute distress.    Appearance: She is well-developed and well-groomed. She is not ill-appearing or toxic-appearing.  HENT:     Head: Normocephalic.     Right Ear: Hearing normal.     Left Ear: Hearing normal.  Eyes:     General: Lids are normal.        Right eye: No discharge.        Left eye: No discharge.     Conjunctiva/sclera: Conjunctivae normal.     Pupils: Pupils are equal, round, and reactive to light.  Neck:     Thyroid: No thyromegaly.     Vascular: No carotid bruit.  Cardiovascular:     Rate and Rhythm: Normal rate and regular rhythm.     Heart sounds: Normal heart sounds. No murmur heard.    No gallop.  Pulmonary:     Effort: Pulmonary effort is normal. No accessory muscle usage or respiratory distress.     Breath sounds: Normal breath sounds.  Chest:  Breasts:    Right: Normal. No swelling or nipple discharge.     Left: Skin change and tenderness present. No swelling, mass or nipple discharge.       Comments: Dense breast tissue bilaterally, especially noted to 12 o'clock aspect on left breast. Abdominal:     General: Bowel sounds are normal. There is no distension.     Palpations: Abdomen is soft.     Tenderness: There is no abdominal tenderness. There is no right CVA tenderness or left CVA tenderness.  Musculoskeletal:     Cervical back: Normal range of motion and neck supple.     Thoracic back: No swelling, spasms or tenderness. Normal range of motion. No scoliosis.       Back:     Right lower leg: No edema.     Left lower leg: No edema.  Lymphadenopathy:     Upper Body:     Right upper body: No supraclavicular, axillary or pectoral adenopathy.     Left upper body: No supraclavicular, axillary or pectoral adenopathy.  Skin:    General: Skin is warm and dry.  Neurological:     Mental Status: She is alert and oriented to person, place, and time.     Deep Tendon Reflexes: Reflexes are normal and symmetric.     Reflex Scores:       Brachioradialis reflexes are 2+ on the right side and 2+ on the left side.      Patellar reflexes are 2+ on the right side and 2+ on the left side. Psychiatric:        Attention and Perception: Attention normal.        Mood and Affect: Mood normal.        Speech: Speech normal.        Behavior: Behavior normal. Behavior is cooperative.  Thought Content: Thought content normal.    Results for orders placed or performed in visit on 08/27/22  WET PREP FOR [place], YEAST, CLUE   Specimen: Sterile Swab   Sterile Swab  Result Value Ref Range   Trichomonas Exam Negative Negative   Yeast Exam Negative Negative   Clue Cell Exam Positive (A) Negative  Urinalysis, Routine w reflex microscopic  Result Value Ref Range   Specific Gravity, UA 1.010 1.005 - 1.030   pH, UA 6.0 5.0 - 7.5   Color, UA Yellow Yellow   Appearance Ur Clear Clear   Leukocytes,UA Negative Negative   Protein,UA Negative Negative/Trace   Glucose, UA Negative Negative   Ketones, UA Negative Negative   RBC, UA Negative Negative   Bilirubin, UA Negative Negative   Urobilinogen, Ur 0.2 0.2 - 1.0 mg/dL   Nitrite, UA Negative Negative      Assessment & Plan:   Problem List Items Addressed This Visit       Genitourinary   Bacterial vaginosis - Primary    Ongoing urinary symptoms -- UA overall negative, but wet prep + clue cells, negative trich and yeast.  Educated patient on this finding and results.  Start Flagyl BID for 7 days, educated her on how to take this and side effects.  If ongoing symptoms then return to office.      Relevant Medications   metroNIDAZOLE (FLAGYL) 500 MG tablet   Other Relevant Orders   Urinalysis, Routine w reflex microscopic (Completed)   WET PREP FOR [place], YEAST, CLUE (Completed)     Other   Breast pain, left    Overall exam reassuring with no masses noted, some skin change left side (appears more rash in appearance) and dense breast tissue.  Will order diagnostic and ultrasound of  breast left side.  Discussed with her and provided her # to schedule.      Relevant Orders   MM DIAG BREAST TOMO BILATERAL   US BREAST LTD UNI LEFT INC AXILLA   Change of skin of breast    Refer to breast pain plan of care.      Relevant Orders   MM DIAG BREAST TOMO BILATERAL   US BREAST LTD UNI LEFT INC AXILLA   Low back pain    Was noted to have degenerative changes and Schmorl's nodes (which go with degenerative changes) on imaging MRI December 2022.  Suspect current pain related to BV infection, if ongoing pain then alert provider.  Will have return in 4 weeks to follow-up for pain with PCP.        Follow up plan: Return in about 4 weeks (around 09/24/2022) for Dr. Wynetta Emery for back pain.

## 2022-08-27 NOTE — Patient Instructions (Signed)
Please call to schedule your mammogram and/or bone density: [facility] at [facility] [place]: [address], [place], [place] [address] Phone: [phone]  [place] at [facility] [address] [place],  [place]  [address] Phone: [phone]    Bacterial Vaginosis  Bacterial vaginosis is an infection of the vagina. It happens when too many normal germs (healthy bacteria) grow in the vagina. This infection can make it easier to get other infections from sex (STIs). It is very important for pregnant women to get treated. This infection can cause babies to be born early or at a low birth weight. What are the causes? This infection is caused by an increase in certain germs that grow in the vagina. You cannot get this infection from toilet seats, bedsheets, swimming pools, or things that touch your vagina. What increases the risk? Having sex with a new person or more than one person. Having sex without protection. Douching. Having an intrauterine device (IUD). Smoking. Using drugs or drinking alcohol. These can lead you to do things that are risky. Taking certain antibiotic medicines. Being pregnant. What are the signs or symptoms? Some women have no symptoms. Symptoms may include: A discharge from your vagina. It may be gray or white. It can be watery or foamy. A fishy smell. This can happen after sex or during your menstrual period. Itching in and around your vagina. A feeling of burning or pain when you pee (urinate). How is this treated? This infection is treated with antibiotic medicines. These may be given to you as: A pill. A cream for your vagina. A medicine that you put into your vagina (suppository). If the infection comes back after treatment, you may need more antibiotics. Follow these instructions at home: Medicines Take over-the-counter and prescription medicines as told by your doctor. Take or use your  antibiotic medicine as told by your doctor. Do not stop taking or using it, even if you start to feel better. General instructions If the person you have sex with is a woman, tell her that you have this infection. She will need to follow up with her doctor. If you have a female partner, he does not need to be treated. Do not have sex until you finish treatment. Drink enough fluid to keep your pee pale yellow. Keep your vagina and butt clean. Wash the area with warm water each day. Wipe from front to back after you use the toilet. If you are breastfeeding a baby, ask your doctor if you should keep doing so during treatment. Keep all follow-up visits. How is this prevented? Self-care Do not douche. Use only warm water to wash around your vagina. Wear underwear that is cotton or lined with cotton. Do not wear tight pants and pantyhose, especially in the summer. Safe sex Use protection when you have sex. This includes: Use condoms. Use dental dams. This is a thin layer that protects the mouth during oral sex. Limit how many people you have sex with. To prevent this infection, it is best to have sex with just one person. Get tested for STIs. The person you have sex with should also get tested. Drugs and alcohol Do not smoke or use any products that contain nicotine or tobacco. If you need help quitting, ask your doctor. Do not use drugs. Do not drink alcohol if: Your doctor tells you not to drink. You are pregnant, may be pregnant, or are planning to become pregnant. If you  drink alcohol: Limit how much you have to 0-1 drink a day. Know how much alcohol is in your drink. In the [place] one drink equals one 12 oz bottle of beer (355 mL), one 5 oz glass of wine (148 mL), or one 1 oz glass of hard liquor (44 mL). Where to find more information Centers for Disease Control and Prevention: http://www.wolf.info/ American Sexual Health Association: www.ashastd.org Office on [employer] Health:  [place] Contact a doctor if: Your symptoms do not get better, even after you are treated. You have more discharge or pain when you pee. You have a fever or chills. You have pain in your belly (abdomen) or in the area between your hips. You have pain with sex. You bleed from your vagina between menstrual periods. Summary This infection can happen when too many germs (bacteria) grow in the vagina. This infection can make it easier to get infections from sex (STIs). Treating this can lower that chance. Get treated if you are pregnant. This infection can cause babies to be born early. Do not stop taking or using your antibiotic medicine, even if you start to feel better. This information is not intended to replace advice given to you by your health care provider. Make sure you discuss any questions you have with your health care provider. Document Revised: 06/01/2020 Document Reviewed: 06/01/2020 Elsevier Patient Education  [place].

## 2022-08-27 NOTE — Assessment & Plan Note (Signed)
Was noted to have degenerative changes and Schmorl's nodes (which go with degenerative changes) on imaging MRI December 2022.  Suspect current pain related to BV infection, if ongoing pain then alert provider.  Will have return in 4 weeks to follow-up for pain with PCP.

## 2022-08-27 NOTE — Assessment & Plan Note (Signed)
Ongoing urinary symptoms -- UA overall negative, but wet prep + clue cells, negative trich and yeast.  Educated patient on this finding and results.  Start Flagyl BID for 7 days, educated her on how to take this and side effects.  If ongoing symptoms then return to office.

## 2022-08-27 NOTE — Assessment & Plan Note (Signed)
Overall exam reassuring with no masses noted, some skin change left side (appears more rash in appearance) and dense breast tissue.  Will order diagnostic and ultrasound of breast left side.  Discussed with her and provided her # to schedule.

## 2022-08-27 NOTE — Assessment & Plan Note (Signed)
Refer to breast pain plan of care.

## 2022-09-16 ENCOUNTER — Ambulatory Visit
Admission: RE | Admit: 2022-09-16 | Discharge: 2022-09-16 | Disposition: A | Discharge status: Home / Self Care | Payer: 59 | Source: Ambulatory Visit | Attending: Nurse Practitioner | Admitting: Nurse Practitioner

## 2022-09-16 ENCOUNTER — Ambulatory Visit: Payer: 59 | Admitting: Family Medicine

## 2022-09-16 DIAGNOSIS — R234 Changes in skin texture: Secondary | ICD-10-CM | POA: Insufficient documentation

## 2022-09-16 DIAGNOSIS — N644 Mastodynia: Secondary | ICD-10-CM

## 2022-09-17 NOTE — Progress Notes (Signed)
Contacted via MyChart   Normal mammogram, may repeat in one year:)

## 2022-09-25 ENCOUNTER — Ambulatory Visit (INDEPENDENT_AMBULATORY_CARE_PROVIDER_SITE_OTHER): Payer: 59 | Admitting: Family Medicine

## 2022-09-25 ENCOUNTER — Encounter: Payer: Self-pay | Admitting: Family Medicine

## 2022-09-25 VITALS — BP 159/80 | HR 79 | Temp 98.4°F | Wt 122.7 lb

## 2022-09-25 DIAGNOSIS — R310 Gross hematuria: Secondary | ICD-10-CM

## 2022-09-25 DIAGNOSIS — M5440 Lumbago with sciatica, unspecified side: Secondary | ICD-10-CM

## 2022-09-25 DIAGNOSIS — Z87440 Personal history of urinary (tract) infections: Secondary | ICD-10-CM | POA: Diagnosis not present

## 2022-09-25 DIAGNOSIS — G4700 Insomnia, unspecified: Secondary | ICD-10-CM

## 2022-09-25 DIAGNOSIS — R1011 Right upper quadrant pain: Secondary | ICD-10-CM | POA: Diagnosis not present

## 2022-09-25 DIAGNOSIS — R634 Abnormal weight loss: Secondary | ICD-10-CM

## 2022-09-25 DIAGNOSIS — F411 Generalized anxiety disorder: Secondary | ICD-10-CM

## 2022-09-25 DIAGNOSIS — G894 Chronic pain syndrome: Secondary | ICD-10-CM

## 2022-09-25 DIAGNOSIS — G8929 Other chronic pain: Secondary | ICD-10-CM

## 2022-09-25 DIAGNOSIS — J340 Abscess, furuncle and carbuncle of nose: Secondary | ICD-10-CM

## 2022-09-25 LAB — URINALYSIS, ROUTINE W REFLEX MICROSCOPIC
Bilirubin, UA: NEGATIVE
Glucose, UA: NEGATIVE
Ketones, UA: NEGATIVE
Leukocytes,UA: NEGATIVE
Nitrite, UA: NEGATIVE
Protein,UA: NEGATIVE
RBC, UA: NEGATIVE
Specific Gravity, UA: 1.02 (ref 1.005–1.030)
Urobilinogen, Ur: 0.2 mg/dL (ref 0.2–1.0)
pH, UA: 7 (ref 5.0–7.5)

## 2022-09-25 LAB — WET PREP FOR TRICH, YEAST, CLUE
Clue Cell Exam: NEGATIVE
Trichomonas Exam: NEGATIVE
Yeast Exam: NEGATIVE

## 2022-09-25 MED ORDER — PREGABALIN 75 MG PO CAPS: ORAL_CAPSULE | ORAL | 1 refills | Status: DC

## 2022-09-25 MED ORDER — ESZOPICLONE 3 MG PO TABS: 3.0000 mg | ORAL_TABLET | Freq: Every day | ORAL | 0 refills | Status: DC

## 2022-09-25 MED ORDER — MUPIROCIN 2 % EX OINT
1.0000 | TOPICAL_OINTMENT | Freq: Two times a day (BID) | CUTANEOUS | 0 refills | Status: DC
Start: 2022-09-25 — End: 2022-10-08

## 2022-09-25 NOTE — Progress Notes (Signed)
BP (!) 159/80   Pulse 79   Temp 98.4 F (36.9 C)   Wt 122 lb 11.2 oz (55.7 kg)   LMP 12/04/2021 (Exact Date)   SpO2 100%   BMI 20.42 kg/m    Subjective:    Patient ID: Elizabeth Peters, female    DOB: [birth year], 41 y.o.   MRN: [id number]  HPI: FRANKYE SCHWEGEL is a 41 y.o. female  Chief Complaint  Patient presents with  . Back Pain    Patient states she is still having ongoing lower back pain that has worsened, states pain radiates to right flank and vaginal area. Patient states she is having frequent urination still and urine is brown, did complete antibiotics.    Saw neurology in August- they think it's ?long covid or ADD, recommended neuropsych testing and seeing psychiatry. She was starting on trileptal for neurogenic pain.   Had her gall bladder out in August, unfortunately her pain has not improved  INSOMNIA Duration: chronic Satisfied with sleep quality: no Difficulty falling asleep: yes Difficulty staying asleep: yes Waking a few hours after sleep onset: yes Early morning awakenings: yes Daytime hypersomnolence: no Wakes feeling refreshed: no Good sleep hygiene: yes Apnea: no Snoring: no Depressed/anxious mood: yes Recent stress: yes Restless legs/nocturnal leg cramps: yes Chronic pain/arthritis: yes History of sleep study: yes Treatments attempted:  lunesta, belsomra, melatonin, uinsom, benadryl, and ambien     Relevant past medical, surgical, family and social history reviewed and updated as indicated. Interim medical history since our last visit reviewed. Allergies and medications reviewed and updated.  Review of Systems  Constitutional:  Positive for appetite change, chills, diaphoresis, fatigue and unexpected weight change. Negative for activity change and fever.  HENT: Negative.    Respiratory: Negative.    Cardiovascular: Negative.   Gastrointestinal:  Positive for abdominal pain. Negative for abdominal distention, anal bleeding, blood in stool,  constipation, diarrhea, nausea, rectal pain and vomiting.  Genitourinary: Negative.   Musculoskeletal: Negative.   Skin: Negative.   Neurological: Negative.   Psychiatric/Behavioral:  Positive for dysphoric mood. Negative for agitation, behavioral problems, confusion, decreased concentration, hallucinations, self-injury, sleep disturbance and suicidal ideas. The patient is nervous/anxious. The patient is not hyperactive.     Per HPI unless specifically indicated above     Objective:    BP (!) 159/80   Pulse 79   Temp 98.4 F (36.9 C)   Wt 122 lb 11.2 oz (55.7 kg)   LMP 12/04/2021 (Exact Date)   SpO2 100%   BMI 20.42 kg/m   Wt Readings from Last 3 Encounters:  09/25/22 122 lb 11.2 oz (55.7 kg)  08/27/22 127 lb 9.6 oz (57.9 kg)  08/15/22 128 lb (58.1 kg)    Physical Exam Vitals and nursing note reviewed.  Constitutional:      General: She is not in acute distress.    Appearance: Normal appearance. She is normal weight. She is not ill-appearing, toxic-appearing or diaphoretic.  HENT:     Head: Normocephalic and atraumatic.     Right Ear: External ear normal.     Left Ear: External ear normal.     Nose: Nose normal.     Mouth/Throat:     Mouth: Mucous membranes are moist.     Pharynx: Oropharynx is clear.  Eyes:     General: No scleral icterus.       Right eye: No discharge.        Left eye: No discharge.     Extraocular  Movements: Extraocular movements intact.     Conjunctiva/sclera: Conjunctivae normal.     Pupils: Pupils are equal, round, and reactive to light.  Cardiovascular:     Rate and Rhythm: Normal rate and regular rhythm.     Pulses: Normal pulses.     Heart sounds: Normal heart sounds. No murmur heard.    No friction rub. No gallop.  Pulmonary:     Effort: Pulmonary effort is normal. No respiratory distress.     Breath sounds: Normal breath sounds. No stridor. No wheezing, rhonchi or rales.  Chest:     Chest wall: No tenderness.  Musculoskeletal:         General: Normal range of motion.     Cervical back: Normal range of motion and neck supple.  Skin:    General: Skin is warm and dry.     Capillary Refill: Capillary refill takes less than 2 seconds.     Coloration: Skin is not jaundiced or pale.     Findings: No bruising, erythema, lesion or rash.  Neurological:     General: No focal deficit present.     Mental Status: She is alert and oriented to person, place, and time. Mental status is at baseline.  Psychiatric:        Mood and Affect: Mood normal.        Behavior: Behavior normal.        Thought Content: Thought content normal.        Judgment: Judgment normal.    Results for orders placed or performed in visit on 09/25/22  Urine Culture   Specimen: Urine   UR  Result Value Ref Range   Urine Culture, Routine Final report    Organism ID, Bacteria Comment   WET PREP FOR TRICH, YEAST, CLUE   Urine  Result Value Ref Range   Trichomonas Exam Negative Negative   Yeast Exam Negative Negative   Clue Cell Exam Negative Negative  Thyroid antibodies  Result Value Ref Range   Thyroperoxidase Ab SerPl-aCnc 11 0 - 34 IU/mL   Thyroglobulin Antibody <1.0 0.0 - 0.9 IU/mL  Magnesium  Result Value Ref Range   Magnesium 2.7 (H) 1.6 - 2.3 mg/dL  Urinalysis, Routine w reflex microscopic  Result Value Ref Range   Specific Gravity, UA 1.020 1.005 - 1.030   pH, UA 7.0 5.0 - 7.5   Color, UA Yellow Yellow   Appearance Ur Clear Clear   Leukocytes,UA Negative Negative   Protein,UA Negative Negative/Trace   Glucose, UA Negative Negative   Ketones, UA Negative Negative   RBC, UA Negative Negative   Bilirubin, UA Negative Negative   Urobilinogen, Ur 0.2 0.2 - 1.0 mg/dL   Nitrite, UA Negative Negative      Assessment & Plan:   Problem List Items Addressed This Visit       Other   Weight loss - Primary   Relevant Orders   Thyroid antibodies (Completed)   Magnesium (Completed)   CT Abdomen Pelvis W Contrast   Other Visit Diagnoses      History of UTI       Relevant Medications   mupirocin ointment (BACTROBAN) 2 %   Other Relevant Orders   Urinalysis, Routine w reflex microscopic (Completed)   Urine Culture (Completed)   WET PREP FOR TRICH, YEAST, CLUE   Nasal ulcer       Right upper quadrant abdominal pain       Relevant Orders   CT Abdomen Pelvis W Contrast  Gross hematuria       Relevant Orders   CT Abdomen Pelvis W Contrast        Follow up plan: Return in about 7 weeks (around 11/13/2022) for records release spine doctor Pattison.

## 2022-09-26 ENCOUNTER — Encounter: Payer: Self-pay | Admitting: Family Medicine

## 2022-09-27 LAB — THYROID ANTIBODIES
Thyroglobulin Antibody: <1 IU/mL (ref 0.0–0.9)
Thyroperoxidase Ab SerPl-aCnc: 11 IU/mL (ref 0–34)

## 2022-09-27 LAB — MAGNESIUM: Magnesium: 2.7 mg/dL — ABNORMAL HIGH (ref 1.6–2.3)

## 2022-09-27 LAB — URINE CULTURE

## 2022-10-02 ENCOUNTER — Telehealth: Payer: Self-pay | Admitting: *Deleted

## 2022-10-02 NOTE — Progress Notes (Signed)
[facility] POST-OP OFFICE VISIT  10/08/2022  HPI: Elizabeth Peters is a 41 y.o. female s/p  Robotic Cholecystectomy from August 16.    ED visit: Elizabeth Peters presents for right-sided abdominal pain. She has some tenderness to bedside. Vital signs are normal. Labs show no leukocytosis, organ dysfunction. Urinalysis shows no evidence of infection. CT scan shows a right posterior pelvic mass. Ultrasound of this area shows what appears to be a hemorrhagic cyst. This will be treated symptomatically and will require gynecology follow-up.   Vital signs: BP 138/87   Pulse 73   Temp 97.9 F (36.6 C) (Oral)   Ht '5\' 5"'$  (1.651 m)   Wt 121 lb (54.9 kg)   LMP 12/04/2021 (Exact Date)   SpO2 100%   BMI 20.14 kg/m    Physical Exam: Constitutional: Appears well Abdomen: Benign, flat, nontender.  Reproducible tenderness on palpation to the right costal margin, otherwise tenderness is localized around the right costal margin. Skin: Incisions well-healed.     CT Abdomen and Pelvis with contrast   HISTORY: Abdominal pain   COMPARISON: None   TECHNIQUE: Helical axial CT images are obtained from the domes of the  diaphragm to the pubic symphysis following intravenous contrast  administration. Dose reduction was obtained with Automatic Exposure Control  (AEC) or, if AEC could not be utilized, by manual adjustment of the mA  and/or kV according to patient size.   CONTRAST: 100 mL of Isovue-300   FINDINGS:  Gallbladder is contracted. There is no abnormal biliary  dilatation. Tiny subcentimeter hypodensity in the lateral segment left  hepatic lobe too small to characterize, but doubtful significance. Focal  fatty infiltration in the left hepatic lobe adjacent to the fissure for the  ligamentum teres. Liver is otherwise unremarkable. The spleen, pancreas,  kidneys, and adrenals are unremarkable.   No evidence of appendicitis. No bowel obstruction or bowel wall thickening.  No  free air or free fluid within the peritoneal cavity.   Prior hysterectomy. There is a right posterior pelvic homogeneous mildly  hypodense mass measuring 34 Hounsfield units in attenuation and 5.6 x 4.7 x  6.5 cm in size. There is no bladder wall thickening. There is no pelvic or  retroperitoneal lymphadenopathy. No abdominal aortic aneurysm.   IMPRESSION:  1. Indeterminate right posterior pelvic mass measuring 6.5 x 5.6 x 4.7 cm.  Potentially a complex or hemorrhagic right ovarian cyst. Recommend pelvic  ultrasound for further characterization.   Electronically Signed by:  Laurena Bering, MD, [facility]  Electronically Signed on:  09/30/2022 8:48 PM  EXAM:  [place] BILE DUCTS   INDICATION:  41 years old Female with RUQ pain, R10.84 Generalized  abdominal pain.    TECHNIQUE: Transabdominal serial grayscale and limited color Doppler  ultrasound images of the intrahepatic and extrahepatic biliary system in  multiple planes.   COMPARISON:  CT 09/30/2022.     FINDINGS:   The patient is status post cholecystectomy by report (this information was  not available at the time of interpretation of the abdomen CT, at which  time it was speculated that there may have been a tiny gallbladder which  was contracted; in retrospect, this was most likely a tiny residual focus  of fluid at the gallbladder fossa). No gallbladder seen on the ultrasound.  Shadowing bowel gas in the region of the gallbladder fossa likely from  adjacent colon seen in this location on the comparison CT. The visualized  common bile duct is 5 mm in caliber.  IMPRESSION:  1. Prior cholecystectomy. No evidence of biliary dilatation.    Electronically Signed by:  Laurena Bering, MD, [facility]  Electronically Signed on:  09/30/2022 10:29 PM  EXAM:  US PELVIC PROTOCOL TRANSABDOMINAL AND TRANSVAGINAL COMPLETE   INDICATION:  41 years old Female with Right pelvic mass/ cyst. Right  abdominal pain.,  R10.84 Generalized abdominal pain.   TECHNIQUE:  Transabdominal and endovaginal serial grayscale and limited  color Doppler ultrasound images of the pelvis were obtained in multiple  planes   Comparison: CT 09/30/2022   FINDINGS:   Uterus: Prior hysterectomy   Right Ovary:   Complex cystic-appearing mass in the right pelvis  corresponding to the pelvic mass seen on CT. This measures 5.6 x 4.7 x 6.5  cm. There are diffuse internal echoes present with a reticular/lacy  appearance and overall hypoechoic and isoechoic appearance of this mass. No  internal vascularity is seen, although blood flow is seen along the  periphery likely corresponding to surrounding ovarian tissue.   Left Ovary: Not visualized (reportedly removed)   Pelvic Free Fluid:  Trace free fluid   Additional Pertinent Findings:  None   IMPRESSION:    1. Findings most likely represent a hemorrhagic right ovarian cyst  measuring 6.5 cm in greatest dimension. Suggest follow-up pelvic ultrasound  in 6-12 weeks to reassess and document resolution or improvement.   Electronically Signed by:  Laurena Bering, MD, [facility]  Electronically Signed on:  09/30/2022 10:25 PM   Assessment/Plan: This is a 41 y.o. female s/p robotic cholecystectomy with persistent right upper quadrant pain, question noted costochondritis.   Patient Active Problem List   Diagnosis Date Noted   Chronic pain 10/06/2022   CCC (chronic calculous cholecystitis) 07/25/2022   Low back pain 07/04/2022   History of brain tumor 05/16/2022   History of depression 05/15/2022   Weight loss    Endometriosis 10/08/2021   Chronic headaches 10/11/2020   DDD (degenerative disc disease), cervical 10/11/2020   Insomnia 10/11/2020   History of fusion of cervical spine 05/30/2020   Family history of malignant neoplasm of breast in first degree relative 05/30/2020   Generalized anxiety disorder 05/30/2020   Enthesopathy of right hip 03/07/2020     -Appears her nausea and vomiting continues to be a bigger issue, and suspect that will not tolerate NSAIDs trial for treatment of costochondritis.  We discussed options including Reglan.  Phenergan is apparently not tolerated.  Zofran is of little help.    Will be seeing her gynecologist for consideration of management of hemorrhagic ovarian cyst as a contributor to her current pain and nausea syndrome. We are happy to see this patient again should things persist beyond expectations.   Ronny Bacon M.D., FACS 10/08/2022, 9:57 PM

## 2022-10-02 NOTE — Telephone Encounter (Signed)
Transition Care Management Unsuccessful Follow-up Telephone Call  Date of discharge and from where:  [facility] 10-31-2022  Attempts:  1st Attempt  Reason for unsuccessful TCM follow-up call:  Left voice message

## 2022-10-02 NOTE — Progress Notes (Signed)
error 

## 2022-10-06 ENCOUNTER — Encounter: Payer: Self-pay | Admitting: Family Medicine

## 2022-10-06 DIAGNOSIS — G8929 Other chronic pain: Secondary | ICD-10-CM | POA: Insufficient documentation

## 2022-10-06 DIAGNOSIS — M797 Fibromyalgia: Secondary | ICD-10-CM | POA: Insufficient documentation

## 2022-10-06 NOTE — Assessment & Plan Note (Signed)
Labs have been normal. Has seen GI in the past, but was not happy with evaluation. She has not gotten any better and continues to have nausea and vomiting. She has not had any additional studies besides colonoscopy and EGD. Will get her back in with GI for further evaluation.

## 2022-10-06 NOTE — Assessment & Plan Note (Signed)
Discussed starting low dose lyrica to try to help with pain symptoms. She is hesitant about this, but willing to try. Recheck 1 month.

## 2022-10-06 NOTE — Assessment & Plan Note (Addendum)
Chronic since childhood. Has never had a sleep study- will refer to look for organic sleep issues. Will start lunesta now to see if that will help until we can get her into sleep medicine. Continue to monitor.

## 2022-10-06 NOTE — Assessment & Plan Note (Signed)
Has been following with spinal doctor for quite a while, but notes that she is not getting any better- we will try to get records from them again to see if there is something we can assist with.

## 2022-10-06 NOTE — Assessment & Plan Note (Signed)
Getting signficantly worse with the physical issues going on. Does not want to see psychiatry or start any medicine at this time. She is scheduled for neuropsych testing- but it has not been done yet.

## 2022-10-07 ENCOUNTER — Other Ambulatory Visit: Payer: Self-pay | Admitting: Family Medicine

## 2022-10-07 DIAGNOSIS — R634 Abnormal weight loss: Secondary | ICD-10-CM

## 2022-10-08 ENCOUNTER — Ambulatory Visit (INDEPENDENT_AMBULATORY_CARE_PROVIDER_SITE_OTHER): Payer: 59 | Admitting: Surgery

## 2022-10-08 ENCOUNTER — Encounter: Payer: Self-pay | Admitting: Surgery

## 2022-10-08 ENCOUNTER — Other Ambulatory Visit: Payer: Self-pay

## 2022-10-08 VITALS — BP 138/87 | HR 73 | Temp 97.9°F | Ht 65.0 in | Wt 121.0 lb

## 2022-10-08 DIAGNOSIS — Z09 Encounter for follow-up examination after completed treatment for conditions other than malignant neoplasm: Secondary | ICD-10-CM

## 2022-10-08 DIAGNOSIS — M940 Chondrocostal junction syndrome [Tietze]: Secondary | ICD-10-CM | POA: Insufficient documentation

## 2022-10-08 DIAGNOSIS — K8010 Calculus of gallbladder with chronic cholecystitis without obstruction: Secondary | ICD-10-CM

## 2022-10-08 NOTE — Patient Instructions (Signed)
Try Ibuprofen for the discomfort 600 mg daily with food.

## 2022-10-14 ENCOUNTER — Ambulatory Visit: Admission: RE | Admit: 2022-10-14 | Payer: 59 | Source: Ambulatory Visit

## 2022-10-15 ENCOUNTER — Ambulatory Visit (INDEPENDENT_AMBULATORY_CARE_PROVIDER_SITE_OTHER): Payer: 59 | Admitting: Obstetrics and Gynecology

## 2022-10-15 ENCOUNTER — Encounter: Payer: Self-pay | Admitting: Obstetrics and Gynecology

## 2022-10-15 VITALS — BP 143/81 | HR 81 | Ht 65.0 in | Wt 121.8 lb

## 2022-10-15 DIAGNOSIS — N83201 Unspecified ovarian cyst, right side: Secondary | ICD-10-CM

## 2022-10-15 DIAGNOSIS — N951 Menopausal and female climacteric states: Secondary | ICD-10-CM | POA: Diagnosis not present

## 2022-10-15 DIAGNOSIS — R102 Pelvic and perineal pain: Secondary | ICD-10-CM | POA: Diagnosis not present

## 2022-10-15 NOTE — Progress Notes (Signed)
Patient presents today to discuss ovarian cyst. She states ongoing problems since hysterectomy but recently was diagnosed with a right hemorrhagic cyst. Patient states pain that radiates along with being nauseated. She also states menopausal symptoms such as mood swings and hot flashes. No additional concerns.

## 2022-10-15 NOTE — Progress Notes (Signed)
HPI:      [name] is a 41 y.o. No obstetric history on file. who LMP was Patient's last menstrual period was 12/04/2021 (exact date).  Subjective:   She presents today stating that she has had numerous complications since her hysterectomy.  She has had over 100 pounds of weight loss.  She has had a complete GI work-up including upper and lower endoscopies as well as CT with contrast.  She has had a liver work-up that included hepatitis and HIV, she has had her gallbladder removed, she has had stool for ova and parasites performed.  She reports that she has diarrhea almost every day.  She has significant right-sided abdominal and pelvic pain.  She states that she feels wet all the time as if she is leaking something out of her vagina.  She has pain every time she urinates.  She has previously noted blood in her urine upon urination. Her most recent ultrasound shows a hemorrhagic ovarian cyst.    Hx: The following portions of the patient's history were reviewed and updated as appropriate:             She  has a past medical history of Anxiety, Brain tumor (benign) ([place]) (2011), Chronic pain due to injury, Endometriosis, Family history of adverse reaction to anesthesia, PONV (postoperative nausea and vomiting), and Wears hearing aid in left ear. She does not have any pertinent problems on file. She  has a past surgical history that includes Dilation and curettage of uterus (2007); Tonsillectomy (2008); Brain surgery (2011); Shoulder surgery (Left, 2011); Spine surgery (2013); neck fusion; Total laparoscopic hysterectomy with bilateral salpingo oophorectomy (N/A, 01/03/2022); Cystoscopy (N/A, 01/03/2022); Colonoscopy (N/A, 04/18/2022); biopsy (N/A, 04/18/2022); Esophagogastroduodenoscopy (N/A, 04/18/2022); and Cholecystectomy. Her family history includes Breast cancer in her cousin; Breast cancer (age of onset: 75) in her mother; Cancer in her father, mother, paternal grandfather, and paternal  grandmother; Colon cancer in her paternal grandmother; Dementia in her maternal grandmother; Diabetes in her mother and paternal grandmother; Heart attack in her father; Lung cancer in her paternal grandfather; Skin cancer in her father; Stroke in her paternal grandmother. She  reports that she quit smoking about 9 years ago. Her smoking use included cigarettes. She has never used smokeless tobacco. She reports that she does not drink alcohol and does not use drugs. She currently has no medications in their medication list. She is allergic to aspirin and penicillins.       Review of Systems:  Review of Systems  Constitutional: Denied constitutional symptoms, night sweats, recent illness, fatigue, fever, insomnia and weight loss.  Eyes: Denied eye symptoms, eye pain, photophobia, vision change and visual disturbance.  Ears/Nose/Throat/Neck: Denied ear, nose, throat or neck symptoms, hearing loss, nasal discharge, sinus congestion and sore throat.  Cardiovascular: Denied cardiovascular symptoms, arrhythmia, chest pain/pressure, edema, exercise intolerance, orthopnea and palpitations.  Respiratory: Denied pulmonary symptoms, asthma, pleuritic pain, productive sputum, cough, dyspnea and wheezing.  Gastrointestinal: See HPI for additional information.  Genitourinary: See HPI for additional information.  Musculoskeletal: Denied musculoskeletal symptoms, stiffness, swelling, muscle weakness and myalgia.  Dermatologic: Denied dermatology symptoms, rash and scar.  Neurologic: Denied neurology symptoms, dizziness, headache, neck pain and syncope.  Psychiatric: Denied psychiatric symptoms, anxiety and depression.  Endocrine: Denied endocrine symptoms including hot flashes and night sweats.   Meds:   No current outpatient medications on file prior to visit.   No current facility-administered medications on file prior to visit.      Objective:  Vitals:   10/15/22 1548  BP: (!) 143/81   Pulse: 81   Filed Weights   10/15/22 1548  Weight: 121 lb 12.8 oz (55.2 kg)              Ultrasound results and CT results reviewed directly with the patient          Assessment:    No obstetric history on file. Patient Active Problem List   Diagnosis Date Noted   Costochondritis 10/08/2022   Chronic pain 10/06/2022   CCC (chronic calculous cholecystitis) 07/25/2022   Low back pain 07/04/2022   History of brain tumor 05/16/2022   History of depression 05/15/2022   Weight loss    Endometriosis 10/08/2021   Chronic headaches 10/11/2020   DDD (degenerative disc disease), cervical 10/11/2020   Insomnia 10/11/2020   History of fusion of cervical spine 05/30/2020   Family history of malignant neoplasm of breast in first degree relative 05/30/2020   Generalized anxiety disorder 05/30/2020   Enthesopathy of right hip 03/07/2020     1. Hemorrhagic cyst of right ovary   2. Menopausal symptom   3. Pelvic pain in female     Strongly doubt hemorrhagic cyst causing weight loss or pelvic pain as described.   Is concerned that all of her issues seem to start after her hysterectomy.   Possible urinary fistula?  Possible bowel or bladder damage at the time of hysterectomy?  Possible pelvic adhesive disease?   Plan:            1.  Recommend urology follow-up to rule out vesicovaginal fistula or otherwise  2.  Recommend follow-up for hemorrhagic cyst in 10 weeks by ultrasound.  Orders Orders Placed This Encounter  Procedures   US PELVIS (TRANSABDOMINAL ONLY)   US PELVIS TRANSVAGINAL NON-OB (TV ONLY)   [place]   Ambulatory referral to Urology    No orders of the defined types were placed in this encounter.     F/U  No follow-ups on file. I spent 35 minutes involved in the care of this patient preparing to see the patient by obtaining and reviewing her medical history (including labs, imaging tests and prior procedures), documenting clinical information in the electronic health  record (EHR), counseling and coordinating care plans, writing and sending prescriptions, ordering tests or procedures and in direct communicating with the patient and medical staff discussing pertinent items from her history and physical exam.  Finis Bud, M.D. 10/15/2022 4:37 PM

## 2022-10-29 NOTE — Progress Notes (Signed)
11/01/2022 4:19 PM   Elizabeth Peters 1981-03-22 [id number]  Referring provider: Harlin Heys, MD [address] [place],  [place] [address]  Chief Complaint  Patient presents with   Pelvic Pain    HPI: 41 year old female who presents today for further evaluation of vaginal drainage.  Please see Dr. Amalia Hailey notes for details.  Notably, she had multiple issues following a hysterectomy.  Her most recent complaint includes chronic vaginal drainage and there was some concern for vesicovaginal fistula.  She also has dysuria with multiple negative urinalyses.  She reports subjective hematuria.  Most recent cross-sectional imaging in the form of CT abdomen pelvis postop on 01/04/2022 does show gas within the urinary bladder but she believes she may have had a urinary catheter.  She did well for several months and then started having urinary issues along with pelvic pain.  She reports that she started to have green-colored urine in the transition to a darker reddish-brown urine.  She did show me a picture of the ladder today.  Along with this, she started to have continuous vaginal discharge where she was constantly wet.  Is constant dribble.   In the interim, she is also developed specifically right pelvic pain and bladder pressure.  She also has bladder frequency.  She denies any overt dysuria, urgency or frequency.  No UTIs although she was treated on 1 occasion for presumed UTI although labs did not particularly support this.  She recently had a CT [facility], CT abdomen pelvis with contrast on 09/30/2022 that showed an indeterminate right posterior pelvic mass measuring 6.5 x 5.6 x 4.7 cm felt to possibly represent hemorrhagic right ovarian cyst.  Follow-up pelvic ultrasound confirmed this likely is the diagnosis.  Most recent urinalysis on 09/30/2022 is negative.  She also reports today that in general she has been fairly unwell since her hysterectomy.  She is lost over 100 pounds.   She also had to have her gallbladder removed and has some GI issues related to this.    PMH: Past Medical History:  Diagnosis Date   Anxiety    Brain tumor (benign) ([place]) 2011   Chronic pain due to injury    spine   Endometriosis    Family history of adverse reaction to anesthesia    Mother and sister - PONV   PONV (postoperative nausea and vomiting)    after hysterectomy   Wears hearing aid in left ear     Surgical History: Past Surgical History:  Procedure Laterality Date   BIOPSY N/A 04/18/2022   Procedure: BIOPSY;  colonoscopySurgeon: Lucilla Lame, MD;  Location: [facility];  Service: Endoscopy;  Laterality: N/A;   BRAIN SURGERY  2011   tumor   CHOLECYSTECTOMY     COLONOSCOPY N/A 04/18/2022   Procedure: COLONOSCOPY;  Surgeon: Lucilla Lame, MD;  Location: [facility];  Service: Endoscopy;  Laterality: N/A;   CYSTOSCOPY N/A 01/03/2022   Procedure: CYSTOSCOPY;  Surgeon: Gae Dry, MD;  Location: [facility];  Service: Gynecology;  Laterality: N/A;   DILATION AND CURETTAGE OF UTERUS  2007   ESOPHAGOGASTRODUODENOSCOPY N/A 04/18/2022   Procedure: ESOPHAGOGASTRODUODENOSCOPY (EGD);  Surgeon: Lucilla Lame, MD;  Location: [facility];  Service: Endoscopy;  Laterality: N/A;   neck fusion     C6-C7 fusion   SHOULDER SURGERY Left 2011   SPINE SURGERY  2013   neck fulsion   TONSILLECTOMY  2008   TOTAL LAPAROSCOPIC HYSTERECTOMY WITH BILATERAL SALPINGO OOPHORECTOMY N/A 01/03/2022  Procedure: TOTAL LAPAROSCOPIC HYSTERECTOMY WITH BILATERAL SALPINGECTOMY, LEFT OOPHORECTOMY;  Surgeon: Gae Dry, MD;  Location: [facility];  Service: Gynecology;  Laterality: N/A;    Home Medications:  Allergies as of 11/01/2022       Reactions   Aspirin Anaphylaxis   Penicillins Anaphylaxis        Medication List    as of November 01, 2022  4:19 PM   You have not been prescribed any medications.     Allergies:  Allergies  Allergen Reactions   Aspirin  Anaphylaxis   Penicillins Anaphylaxis    Family History: Family History  Problem Relation Age of Onset   Diabetes Mother    Cancer Mother    Breast cancer Mother 34   Cancer Father    Skin cancer Father    Heart attack Father    Dementia Maternal Grandmother    Stroke Paternal Grandmother    Diabetes Paternal Grandmother    Cancer Paternal Grandmother    Colon cancer Paternal Grandmother    Cancer Paternal Grandfather    Lung cancer Paternal Grandfather    Breast cancer Cousin        maternal side early 19's    Social History:  reports that she quit smoking about 9 years ago. Her smoking use included cigarettes. She has never used smokeless tobacco. She reports that she does not drink alcohol and does not use drugs.   Physical Exam: BP (!) 151/79 (BP Location: Left Arm, Patient Position: Sitting, Cuff Size: Normal)   Pulse 76   Ht '5\' 5"'$  (1.651 m)   Wt 124 lb 6.4 oz (56.4 kg)   LMP 12/04/2021 (Exact Date)   BMI 20.70 kg/m   Constitutional:  Alert and oriented, No acute distress. HEENT: [place] AT, moist mucus membranes.  Trachea midline, no masses. Cardiovascular: No clubbing, cyanosis, or edema. Respiratory: Normal respiratory effort, no increased work of breathing. Neurologic: Grossly intact, no focal deficits, moving all 4 extremities. Psychiatric: Normal mood and affect.  Laboratory Data: Lab Results  Component Value Date   WBC 7.0 08/09/2022   HGB 11.8 (L) 08/09/2022   HCT 34.5 (L) 08/09/2022   MCV 85.4 08/09/2022   PLT 214 08/09/2022    Lab Results  Component Value Date   CREATININE 0.74 08/09/2022    Lab Results  Component Value Date   HGBA1C 5.0 05/17/2022    Urinalysis Urinalysis today was reviewed, negative  Pertinent Imaging: I personally reviewed multiple studies including 2 CT abdomen pelvis with contrast over the past year, 1 in our system and 1 at [facility].  Agree with radiologic interpretation.   Assessment & Plan:    1.  Vaginal  discharge Continues vaginal drainage is in fact concerning for possible vesicovaginal or vesicoureteral fistula and warrants investigation  I recommended a tampon test, she will take Pyridium and place a tampon.  She will look for discoloration on the tampon consistent with the orange-yellow changes in Prodium and let us know  In addition to the above, we will plan for cystoscopy.  Depending on the result of either 1 of these task, may consider further imaging in the form of CT cystogram versus CT urogram.  2. Abnormal urine color Etiology of this is unclear, urinalysis is been fairly bland  May be medication related - Urinalysis, Complete w Microscopic (For [facility] ONLY); Future - Urine Culture; Future  3.  Pelvic pain Right-sided pelvic pain consistent with right pelvic fluid collection, likely a cyst  Followed by GYN, has  follow-up pelvic ultrasound scheduled.  Return in about 2 weeks (around 11/15/2022) for cysto.  Hollice Espy, MD  [facility] [address], [place], [place] [address] [phone]  I spent 65 total minutes on the day of the encounter including pre-visit review of the medical record, face-to-face time with the patient, and post visit ordering of labs/imaging/tests.

## 2022-11-01 ENCOUNTER — Other Ambulatory Visit
Admission: RE | Admit: 2022-11-01 | Discharge: 2022-11-01 | Disposition: A | Discharge status: Home / Self Care | Payer: 59 | Source: Ambulatory Visit | Attending: Urology | Admitting: Urology

## 2022-11-01 ENCOUNTER — Ambulatory Visit: Payer: 59 | Admitting: Urology

## 2022-11-01 ENCOUNTER — Encounter: Payer: Self-pay | Admitting: Urology

## 2022-11-01 VITALS — BP 151/79 | HR 76 | Ht 65.0 in | Wt 124.4 lb

## 2022-11-01 DIAGNOSIS — R3989 Other symptoms and signs involving the genitourinary system: Secondary | ICD-10-CM | POA: Diagnosis present

## 2022-11-01 DIAGNOSIS — N898 Other specified noninflammatory disorders of vagina: Secondary | ICD-10-CM

## 2022-11-01 DIAGNOSIS — R102 Pelvic and perineal pain: Secondary | ICD-10-CM | POA: Diagnosis not present

## 2022-11-01 DIAGNOSIS — R300 Dysuria: Secondary | ICD-10-CM | POA: Diagnosis present

## 2022-11-01 LAB — URINALYSIS, COMPLETE (UACMP) WITH MICROSCOPIC
Bacteria, UA: NONE SEEN
Bilirubin Urine: NEGATIVE
Glucose, UA: NEGATIVE mg/dL
Hgb urine dipstick: NEGATIVE
Ketones, ur: NEGATIVE mg/dL
Leukocytes,Ua: NEGATIVE
Nitrite: NEGATIVE
Protein, ur: NEGATIVE mg/dL
RBC / HPF: NONE SEEN RBC/hpf (ref 0–5)
Specific Gravity, Urine: 1.01 (ref 1.005–1.030)
pH: 6 (ref 5.0–8.0)

## 2022-11-03 LAB — URINE CULTURE: Culture: NO GROWTH

## 2022-11-13 ENCOUNTER — Ambulatory Visit: Payer: 59 | Admitting: Family Medicine

## 2022-11-15 ENCOUNTER — Other Ambulatory Visit: Payer: 59 | Admitting: Urology

## 2022-12-17 ENCOUNTER — Encounter: Payer: Self-pay | Admitting: Family Medicine

## 2022-12-20 MED ORDER — TIZANIDINE HCL 2 MG PO TABS: 2.0000 mg | ORAL_TABLET | Freq: Three times a day (TID) | ORAL | 2 refills | Status: DC | PRN

## 2022-12-25 ENCOUNTER — Ambulatory Visit (INDEPENDENT_AMBULATORY_CARE_PROVIDER_SITE_OTHER): Payer: 59

## 2022-12-25 DIAGNOSIS — R102 Pelvic and perineal pain: Secondary | ICD-10-CM

## 2023-01-16 ENCOUNTER — Ambulatory Visit: Payer: 59 | Admitting: Neurology

## 2023-01-16 ENCOUNTER — Ambulatory Visit (INDEPENDENT_AMBULATORY_CARE_PROVIDER_SITE_OTHER): Payer: 59 | Admitting: Family Medicine

## 2023-01-16 ENCOUNTER — Encounter: Payer: Self-pay | Admitting: Neurology

## 2023-01-16 ENCOUNTER — Encounter: Payer: Self-pay | Admitting: Family Medicine

## 2023-01-16 VITALS — BP 116/78 | HR 74 | Temp 98.4°F | Ht 65.0 in | Wt 141.9 lb

## 2023-01-16 DIAGNOSIS — Z113 Encounter for screening for infections with a predominantly sexual mode of transmission: Secondary | ICD-10-CM

## 2023-01-16 NOTE — Progress Notes (Signed)
BP 116/78   Pulse 74   Temp 98.4 F (36.9 C) (Oral)   Ht '5\' 5"'$  (1.651 m)   Wt 141 lb 14.4 oz (64.4 kg)   LMP 12/04/2021 (Exact Date)   SpO2 100%   BMI 23.61 kg/m    Subjective:    Patient ID: Elizabeth Peters, female    DOB: 1981-10-31, 42 y.o.   MRN: [id number]  HPI: Elizabeth Peters is a 42 y.o. female  Chief Complaint  Patient presents with   STI screening   STD SCREENING Sexual activity:  concerned about STI Contraception: no Recent unprotected intercourse: yes History of sexually transmitted diseases: no Previous sexually transmitted disease screening: no Genital lesions: no Vaginal discharge: no Dysuria: no Swollen lymph nodes: no Fevers: no Rash: no  Relevant past medical, surgical, family and social history reviewed and updated as indicated. Interim medical history since our last visit reviewed. Allergies and medications reviewed and updated.  Review of Systems  Constitutional: Negative.   Respiratory: Negative.    Cardiovascular: Negative.   Gastrointestinal: Negative.   Musculoskeletal: Negative.   Psychiatric/Behavioral: Negative.      Per HPI unless specifically indicated above     Objective:    BP 116/78   Pulse 74   Temp 98.4 F (36.9 C) (Oral)   Ht '5\' 5"'$  (1.651 m)   Wt 141 lb 14.4 oz (64.4 kg)   LMP 12/04/2021 (Exact Date)   SpO2 100%   BMI 23.61 kg/m   Wt Readings from Last 3 Encounters:  01/16/23 141 lb 14.4 oz (64.4 kg)  11/01/22 124 lb 6.4 oz (56.4 kg)  10/15/22 121 lb 12.8 oz (55.2 kg)    Physical Exam Vitals and nursing note reviewed.  Constitutional:      General: She is not in acute distress.    Appearance: Normal appearance. She is not ill-appearing, toxic-appearing or diaphoretic.  HENT:     Head: Normocephalic and atraumatic.     Right Ear: External ear normal.     Left Ear: External ear normal.     Nose: Nose normal.     Mouth/Throat:     Mouth: Mucous membranes are moist.     Pharynx: Oropharynx is clear.  Eyes:      General: No scleral icterus.       Right eye: No discharge.        Left eye: No discharge.     Extraocular Movements: Extraocular movements intact.     Conjunctiva/sclera: Conjunctivae normal.     Pupils: Pupils are equal, round, and reactive to light.  Cardiovascular:     Rate and Rhythm: Normal rate and regular rhythm.     Pulses: Normal pulses.     Heart sounds: Normal heart sounds. No murmur heard.    No friction rub. No gallop.  Pulmonary:     Effort: Pulmonary effort is normal. No respiratory distress.     Breath sounds: Normal breath sounds. No stridor. No wheezing, rhonchi or rales.  Chest:     Chest wall: No tenderness.  Musculoskeletal:        General: Normal range of motion.     Cervical back: Normal range of motion and neck supple.  Skin:    General: Skin is warm and dry.     Capillary Refill: Capillary refill takes less than 2 seconds.     Coloration: Skin is not jaundiced or pale.     Findings: No bruising, erythema, lesion or rash.  Neurological:  General: No focal deficit present.     Mental Status: She is alert and oriented to person, place, and time. Mental status is at baseline.  Psychiatric:        Mood and Affect: Mood normal.        Behavior: Behavior normal.        Thought Content: Thought content normal.        Judgment: Judgment normal.     Results for orders placed or performed during the hospital encounter of 11/01/22  Urine Culture   Specimen: Urine, Random  Result Value Ref Range   Specimen Description      URINE, RANDOM Performed at [facility], [address] [place], [place] [address]    Special Requests      NONE Performed at [facility] [place], [address], [place], [place] [address]    Culture      NO GROWTH Performed at [facility], [place] [address], [place], [place] [address]    Report Status 11/03/2022 FINAL   Urinalysis, Complete w Microscopic  Result Value Ref Range   Color, Urine  YELLOW YELLOW   APPearance CLEAR CLEAR   Specific Gravity, Urine 1.010 1.005 - 1.030   pH 6.0 5.0 - 8.0   Glucose, UA NEGATIVE NEGATIVE mg/dL   Hgb urine dipstick NEGATIVE NEGATIVE   Bilirubin Urine NEGATIVE NEGATIVE   Ketones, ur NEGATIVE NEGATIVE mg/dL   Protein, ur NEGATIVE NEGATIVE mg/dL   Nitrite NEGATIVE NEGATIVE   Leukocytes,Ua NEGATIVE NEGATIVE   Squamous Epithelial / HPF 0-5 0 - 5   WBC, UA 0-5 0 - 5 WBC/hpf   RBC / HPF NONE SEEN 0 - 5 RBC/hpf   Bacteria, UA NONE SEEN NONE SEEN      Assessment & Plan:   Problem List Items Addressed This Visit   None Visit Diagnoses     Routine screening for STI (sexually transmitted infection)    -  Primary   Would like to have labs drawn. Treat as needed. Call with any concerns.   Relevant Orders   HSV 1 and 2 Ab, IgG   HIV Antibody (routine testing w rflx)   GC/Chlamydia Probe Amp   RPR w/reflex to TrepSure   Acute Viral Hepatitis (HAV, HBV, HCV)        Follow up plan: Return in about 6 months (around 07/17/2023) for physical.

## 2023-01-17 LAB — HIV ANTIBODY (ROUTINE TESTING W REFLEX): HIV Screen 4th Generation wRfx: NONREACTIVE

## 2023-01-17 LAB — ACUTE VIRAL HEPATITIS (HAV, HBV, HCV)
HCV Ab: NONREACTIVE
Hep A IgM: NEGATIVE
Hep B C IgM: NEGATIVE
Hepatitis B Surface Ag: NEGATIVE

## 2023-01-17 LAB — RPR W/REFLEX TO TREPSURE: RPR: NONREACTIVE

## 2023-01-17 LAB — HSV 1 AND 2 AB, IGG
HSV 1 Glycoprotein G Ab, IgG: 58.4 index — ABNORMAL HIGH (ref 0.00–0.90)
HSV 2 IgG, Type Spec: <0.91 index (ref 0.00–0.90)

## 2023-01-17 LAB — HCV INTERPRETATION

## 2023-01-17 LAB — T PALLIDUM ANTIBODY, EIA: T pallidum Antibody, EIA: NEGATIVE

## 2023-01-20 LAB — GC/CHLAMYDIA PROBE AMP
Chlamydia trachomatis, NAA: NEGATIVE
Neisseria Gonorrhoeae by PCR: NEGATIVE

## 2023-02-05 NOTE — Progress Notes (Signed)
Result and follow-up question forwarded to patient via [place].

## 2023-02-05 NOTE — Progress Notes (Signed)
Very small right ovarian cyst noted.  Less than 1 cm in size.  Other cysts are noted on your ovary by ultrasound.  These also appear benign. Did you ever get a chance to follow-up with urology?

## 2023-03-04 ENCOUNTER — Other Ambulatory Visit: Payer: Self-pay | Admitting: Family Medicine

## 2023-03-05 ENCOUNTER — Encounter: Payer: Self-pay | Admitting: Family Medicine

## 2023-03-05 MED ORDER — TIZANIDINE HCL 2 MG PO TABS: 2.0000 mg | ORAL_TABLET | Freq: Three times a day (TID) | ORAL | 2 refills | Status: DC | PRN

## 2023-03-05 NOTE — Telephone Encounter (Signed)
Requested medication (s) are due for refill today: Yes  Requested medication (s) are on the active medication list: Yes  Last refill:  12/20/22  Future visit scheduled: No  Notes to clinic:  See request.    Requested Prescriptions  Pending Prescriptions Disp Refills   tiZANidine (ZANAFLEX) 2 MG tablet [Pharmacy Med Name: TIZANIDINE HCL 2 MG TABLET] 90 tablet 2    Sig: TAKE 1 TABLET BY MOUTH EVERY 8 HOURS AS NEEDED FOR MUSCLE SPASMS.     Not Delegated - Cardiovascular:  Alpha-2 Agonists - tizanidine Failed - 03/04/2023 12:46 PM      Failed - This refill cannot be delegated      Passed - Valid encounter within last 6 months    Recent Outpatient Visits           1 month ago Routine screening for STI (sexually transmitted infection)   [place], Megan P, DO   5 months ago Weight loss   [place], [place], DO   6 months ago Bacterial vaginosis   [place], Henrine Screws T, NP   8 months ago RUQ pain   [place], [place], DO   9 months ago Green-colored urine   [place], [place], [place]

## 2023-05-20 ENCOUNTER — Other Ambulatory Visit: Payer: Self-pay | Admitting: Family Medicine

## 2023-05-20 NOTE — Telephone Encounter (Signed)
Requested medication (s) are due for refill today: Yes  Requested medication (s) are on the active medication list: Yes  Last refill:  03/05/23  Future visit scheduled: No  Notes to clinic:  See request.    Requested Prescriptions  Pending Prescriptions Disp Refills   tiZANidine (ZANAFLEX) 2 MG tablet [Pharmacy Med Name: TIZANIDINE HCL 2 MG TABLET] 90 tablet 2    Sig: TAKE 1 TABLET BY MOUTH EVERY 8 HOURS AS NEEDED FOR MUSCLE SPASMS.     Not Delegated - Cardiovascular:  Alpha-2 Agonists - tizanidine Failed - 05/20/2023 12:00 PM      Failed - This refill cannot be delegated      Passed - Valid encounter within last 6 months    Recent Outpatient Visits           4 months ago Routine screening for STI (sexually transmitted infection)   [place] [facility] [place], Megan P, DO   7 months ago Weight loss   [place] [facility] [place], Megan P, DO   8 months ago Bacterial vaginosis   [place] [facility] [place], Corrie Dandy T, NP   10 months ago RUQ pain   [place] [facility] [place], [employer], DO   11 months ago Green-colored urine   [place] [facility] [place], [place], [place]

## 2023-06-02 ENCOUNTER — Other Ambulatory Visit: Payer: Self-pay | Admitting: Family Medicine

## 2023-06-02 NOTE — Telephone Encounter (Signed)
Attempted to reach patient to get scheduled regarding this medication, LVM for her to call office back.  Put in CRM.

## 2023-06-02 NOTE — Telephone Encounter (Signed)
I never wrote this. I'm happy to, but we should see her, or she can get it from whoever wrote it before

## 2023-06-26 ENCOUNTER — Other Ambulatory Visit: Payer: Self-pay | Admitting: Family Medicine

## 2023-06-26 NOTE — Telephone Encounter (Signed)
Requested medication (s) are due for refill today - no  Requested medication (s) are on the active medication list -yes  Future visit scheduled -no  Last refill: -05/20/23 #90 1RF  Notes to clinic: non delegated Rx  Requested Prescriptions  Pending Prescriptions Disp Refills   tiZANidine (ZANAFLEX) 2 MG tablet [Pharmacy Med Name: TIZANIDINE HCL 2 MG TABLET] 90 tablet 1    Sig: TAKE 1 TABLET BY MOUTH EVERY 8 HOURS AS NEEDED FOR MUSCLE SPASM     Not Delegated - Cardiovascular:  Alpha-2 Agonists - tizanidine Failed - 06/26/2023  9:31 AM      Failed - This refill cannot be delegated      Passed - Valid encounter within last 6 months    Recent Outpatient Visits           5 months ago Routine screening for STI (sexually transmitted infection)   [facility] [facility], Megan P, DO   9 months ago Weight loss   [place] [facility] [place], Megan P, DO   10 months ago Bacterial vaginosis   [place] [facility] [place], [place] T, NP   11 months ago RUQ pain   [place] [facility] [place], [place], DO   1 year ago Green-colored urine   [place] [facility] [place], [place], DO                 Requested Prescriptions  Pending Prescriptions Disp Refills   tiZANidine (ZANAFLEX) 2 MG tablet [Pharmacy Med Name: TIZANIDINE HCL 2 MG TABLET] 90 tablet 1    Sig: TAKE 1 TABLET BY MOUTH EVERY 8 HOURS AS NEEDED FOR MUSCLE SPASM     Not Delegated - Cardiovascular:  Alpha-2 Agonists - tizanidine Failed - 06/26/2023  9:31 AM      Failed - This refill cannot be delegated      Passed - Valid encounter within last 6 months    Recent Outpatient Visits           5 months ago Routine screening for STI (sexually transmitted infection)   [facility], Megan P, DO   9 months ago Weight loss   [place] [facility] [place], Megan P, DO   10 months ago Bacterial  vaginosis   [place] [facility] [place], Corrie Dandy T, NP   11 months ago RUQ pain   [place] [facility] [place], [place], DO   1 year ago Green-colored urine   [place] [facility] [place], [place], [place]

## 2023-08-08 ENCOUNTER — Ambulatory Visit: Payer: Self-pay | Admitting: Family Medicine

## 2024-01-20 DIAGNOSIS — Z0271 Encounter for disability determination: Secondary | ICD-10-CM

## 2024-03-25 NOTE — Progress Notes (Signed)
 "  BP 118/76   Pulse 90   Ht 5' 5 (1.651 m)   Wt 182 lb 12.8 oz (82.9 kg)   LMP 12/04/2021 (Exact Date)   SpO2 97%   BMI 30.42 kg/m    Subjective:    Patient ID: Elizabeth Peters, female    DOB: [birth year], 43 y.o.   MRN: [id number]  HPI: Elizabeth Peters is a 43 y.o. female  Chief Complaint  Patient presents with   Establish Care   Depression    W/ anxiety Abilify and hydroxine not working   Fibromyalgia    Questions concerns about having? Never dx    Discussed the use of AI scribe software for clinical note transcription with the patient, who gave verbal consent to proceed.  History of Present Illness Elizabeth Peters is a 43 year old female who presents to establish care.  She has experienced chronic, widespread pain since her brain tumor surgery in 2011, describing it as 'everything on me hurts except my eyelashes.' The pain is constant, exacerbated by weather changes, and unresponsive to various treatments including massages, magnesium, muscle relaxers, Cymbalta , Lyrica , gabapentin , and cyclobenzaprine . She has not found relief from any of these treatments and feels frustrated at not being believed by healthcare providers.  She has a history of degenerative disc disease in the cervical spine and bone spurs. She has undergone cervical fusion and notes that her neck lacks a natural curve. Despite these findings, she feels her pain is not adequately addressed by her healthcare providers.  She experiences chronic headaches, which she associates with weather changes. She also reports early onset dementia and has seen a neurologist for this condition.  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 She has been inconsistent with medication adherence in the past but is currently taking Seroquel , hydroxyzine, and Abilify, although she reports that Abilify is ineffective. She experiences anxiety upon waking and describes her  sleep as poor despite her watch indicating otherwise. She also reports weight gain, which she attributes to Seroquel , and feels frustrated with her mental health management.  She reports chronic constipation, which has been a long-standing issue except for a period two years ago when she experienced severe weight loss and diarrhea. She has tried various treatments including digestive enzymes, probiotics, Miralax, and magnesium citrate, but remains constipated. She has undergone a colonoscopy and other tests, but no definitive cause has been identified.  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         03/26/2024    9:13 AM 01/16/2023    2:29 PM 08/27/2022    3:01 PM  Depression screen PHQ 2/9  Decreased Interest 3 0 0  Down, Depressed, Hopeless 1 0 0  PHQ - 2 Score 4 0 0  Altered sleeping 0 0 3  Tired, decreased energy 3 0 3  Change in appetite 3 0 3  Feeling bad or failure about yourself  1 0 0  Trouble concentrating 3 0 3  Moving slowly or fidgety/restless 3 0 1  Suicidal thoughts 0 0 0  PHQ-9 Score 17 0 13  Difficult doing work/chores Somewhat difficult Not difficult at all Somewhat difficult       03/26/2024    9:14 AM 01/16/2023    2:29 PM 08/27/2022    3:01 PM 07/02/2022    3:38 PM  GAD 7 : Generalized Anxiety Score  Nervous, Anxious, on Edge 3 0 0  2  Control/stop worrying 1 0 0 0  Worry too much - different things 1 0 0 0  Trouble relaxing 3 0 3 3  Restless 1 0 3 3  Easily annoyed or irritable 3 0 1 3  Afraid - awful might happen 0 0 0 0  Total GAD 7 Score 12 0 7 11  Anxiety Difficulty Somewhat difficult Not difficult at all Somewhat difficult      Relevant past medical, surgical, family and social history reviewed and updated as indicated. Interim medical history since our last visit reviewed. Allergies and medications reviewed and updated.  Review of Systems  Ten systems reviewed and is negative except as mentioned in HPI      Objective:    BP  118/76   Pulse 90   Ht 5' 5 (1.651 m)   Wt 182 lb 12.8 oz (82.9 kg)   LMP 12/04/2021 (Exact Date)   SpO2 97%   BMI 30.42 kg/m    Wt Readings from Last 3 Encounters:  03/26/24 182 lb 12.8 oz (82.9 kg)  01/16/23 141 lb 14.4 oz (64.4 kg)  11/01/22 124 lb 6.4 oz (56.4 kg)    Physical Exam Physical Exam GENERAL: Alert, cooperative, well developed, no acute distress. HEENT: Normocephalic, normal oropharynx, moist mucous membranes. CHEST: Clear to auscultation bilaterally, no wheezes, rhonchi, or crackles. CARDIOVASCULAR: Normal heart rate and rhythm, S1 and S2 normal without murmurs. ABDOMEN: Soft, non-tender, non-distended, without organomegaly, normal bowel sounds. EXTREMITIES: No cyanosis or edema. NEUROLOGICAL: Cranial nerves grossly intact, moves all extremities without gross motor or sensory deficit.   Results for orders placed or performed in visit on 01/16/23  HSV 1 and 2 Ab, IgG   Collection Time: 01/16/23  2:51 PM  Result Value Ref Range   HSV 1 Glycoprotein G Ab, IgG 58.40 (H) 0.00 - 0.90 index   HSV 2 IgG, Type Spec <0.91 0.00 - 0.90 index  HIV Antibody (routine testing w rflx)   Collection Time: 01/16/23  2:51 PM  Result Value Ref Range   HIV Screen 4th Generation wRfx Non Reactive Non Reactive  RPR w/reflex to TrepSure   Collection Time: 01/16/23  2:51 PM  Result Value Ref Range   RPR Non Reactive Non Reactive  Acute Viral Hepatitis (HAV, HBV, HCV)   Collection Time: 01/16/23  2:51 PM  Result Value Ref Range   Hep A IgM Negative Negative   Hepatitis B Surface Ag Negative Negative   Hep B C IgM Negative Negative   HCV Ab Non Reactive Non Reactive  Interpretation:   Collection Time: 01/16/23  2:51 PM  Result Value Ref Range   HCV Interp 1: Comment   T pallidum Antibody, EIA   Collection Time: 01/16/23  2:51 PM  Result Value Ref Range   T pallidum Antibody, EIA Negative Negative  GC/Chlamydia Probe Amp   Collection Time: 01/16/23  3:23 PM   Specimen: Urine    UR  Result Value Ref Range   Chlamydia trachomatis, NAA Negative Negative   Neisseria Gonorrhoeae by PCR Negative Negative       Assessment & Plan:   Problem List Items Addressed This Visit       Nervous and Auditory   History of brain tumor   Relevant Orders   Ambulatory referral to Neurology     Musculoskeletal and Integument   DDD (degenerative disc disease), cervical   Relevant Orders   Ambulatory referral to Pain Clinic     Other   Family history  of malignant neoplasm of breast in first degree relative   Generalized anxiety disorder   Relevant Medications   hydrOXYzine (VISTARIL) 25 MG capsule   cariprazine  (VRAYLAR ) 1.5 MG capsule   Other Relevant Orders   Ambulatory referral to Psychiatry   Chronic pain   Relevant Orders   Ambulatory referral to Neurology   Ambulatory referral to Pain Clinic   ANA   C-reactive protein   Sedimentation rate   Rheumatoid Factor   Cyclic citrul peptide antibody, IgG   Bipolar 1 disorder (HCC) - Primary   Relevant Medications   cariprazine  (VRAYLAR ) 1.5 MG capsule   Other Relevant Orders   Ambulatory referral to Psychiatry   Other Visit Diagnoses       Screening for deficiency anemia       Relevant Orders   CBC with Differential/Platelet     Screening for cholesterol level       Relevant Orders   Lipid panel     Screening for diabetes mellitus       Relevant Orders   Comprehensive metabolic panel with GFR   Hemoglobin A1c     Chronic idiopathic constipation       Relevant Medications   Plecanatide  (TRULANCE ) 3 MG TABS        Assessment and Plan Assessment & Plan Chronic Pain Chronic, severe pain affecting the entire body, unresponsive to previous treatments including duloxetine , pregabalin , gabapentin , and cyclobenzaprine . Pain is exacerbated by weather changes and suspected to be related to fibromyalgia, though not definitively diagnosed. History of brain tumor and cervical spine fusion may contribute. Pain is  debilitating and affects daily functioning. - Refer to neurology for evaluation of potential nerve-related causes. - Refer to pain management for specialized care. - Order nerve conduction study to assess for nerve damage.  Bipolar Disorder Bipolar disorder with history of medication non-compliance. Currently experiencing anxiety and depression despite quetiapine  and hydroxyzine. Aripiprazole was ineffective. Reports anxiety upon waking and panic attacks. Cariprazine  proposed as an alternative to aripiprazole for improved management of bipolar disorder and anxiety. - Discontinue aripiprazole. - Prescribe cariprazine  to manage bipolar disorder and anxiety. - Refer to psychiatry for specialized management. - Schedule follow-up in 4 weeks to assess response to cariprazine .  Chronic Constipation Chronic constipation with previous IBS diagnosis. Unresponsive to digestive enzymes, probiotics, polyethylene glycol, and magnesium citrate. Colonoscopy showed no significant findings. Trulance  proposed to improve bowel regularity. - Prescribe Trulance  to manage constipation and improve bowel regularity.  Hyperlipidemia Hyperlipidemia with recent LDL level of 169 mg/dL.  General Health Maintenance Up to date on mammograms with family history of breast cancer.  Follow-up Monitor response to new treatments and referrals. - Schedule follow-up appointment in 4 weeks to assess response to cariprazine  and overall progress. - Ensure referrals to neurology and pain management are completed. - Review lab results once available and communicate findings.        Follow up plan: Return in about 4 weeks (around 04/23/2024) for follow up.      "

## 2024-03-26 ENCOUNTER — Encounter: Payer: Self-pay | Admitting: Nurse Practitioner

## 2024-03-26 ENCOUNTER — Other Ambulatory Visit: Payer: Self-pay | Admitting: Nurse Practitioner

## 2024-03-26 ENCOUNTER — Ambulatory Visit (INDEPENDENT_AMBULATORY_CARE_PROVIDER_SITE_OTHER): Payer: 59 | Admitting: Nurse Practitioner

## 2024-03-26 VITALS — BP 118/76 | HR 90 | Ht 65.0 in | Wt 182.8 lb

## 2024-03-26 DIAGNOSIS — Z803 Family history of malignant neoplasm of breast: Secondary | ICD-10-CM

## 2024-03-26 DIAGNOSIS — F411 Generalized anxiety disorder: Secondary | ICD-10-CM | POA: Diagnosis not present

## 2024-03-26 DIAGNOSIS — K5904 Chronic idiopathic constipation: Secondary | ICD-10-CM

## 2024-03-26 DIAGNOSIS — F319 Bipolar disorder, unspecified: Secondary | ICD-10-CM

## 2024-03-26 DIAGNOSIS — Z13220 Encounter for screening for lipoid disorders: Secondary | ICD-10-CM

## 2024-03-26 DIAGNOSIS — Z131 Encounter for screening for diabetes mellitus: Secondary | ICD-10-CM

## 2024-03-26 DIAGNOSIS — Z87898 Personal history of other specified conditions: Secondary | ICD-10-CM

## 2024-03-26 DIAGNOSIS — G894 Chronic pain syndrome: Secondary | ICD-10-CM

## 2024-03-26 DIAGNOSIS — M5030 Other cervical disc degeneration, unspecified cervical region: Secondary | ICD-10-CM

## 2024-03-26 DIAGNOSIS — Z130 Encounter for screening for diseases of the blood and blood-forming organs and certain disorders involving the immune mechanism: Secondary | ICD-10-CM

## 2024-03-26 MED ORDER — TRULANCE 3 MG PO TABS: 3.0000 mg | ORAL_TABLET | Freq: Every day | ORAL | 0 refills | Status: DC

## 2024-03-26 MED ORDER — CARIPRAZINE HCL 1.5 MG PO CAPS: 1.5000 mg | ORAL_CAPSULE | Freq: Every day | ORAL | 0 refills | Status: DC

## 2024-03-26 NOTE — Telephone Encounter (Signed)
 Requested medication (s) are due for refill today: Pharmacy comment: Alternative Requested:PA REQUIRED.   Requested medication (s) are on the active medication list: yes  Last refill:  03/26/24  Future visit scheduled: no  Notes to clinic:  Pharmacy comment: Alternative Requested:PA REQUIRED.      Requested Prescriptions  Pending Prescriptions Disp Refills   OLANZapine (ZYPREXA) 5 MG tablet [Pharmacy Med Name: OLANZAPINE 5 MG TABLET]  0     Not Delegated - Psychiatry:  Antipsychotics - Second Generation (Atypical) - olanzapine Failed - 03/26/2024  4:17 PM      Failed - This refill cannot be delegated      Failed - TSH in normal range and within 360 days    TSH  Date Value Ref Range Status  05/17/2022 1.190 0.450 - 4.500 uIU/mL Final         Failed - Lipid Panel in normal range within the last 12 months    Cholesterol, Total  Date Value Ref Range Status  05/17/2022 222 (H) 100 - 199 mg/dL Final   LDL Chol Calc (NIH)  Date Value Ref Range Status  05/17/2022 153 (H) 0 - 99 mg/dL Final   HDL  Date Value Ref Range Status  05/17/2022 59 >39 mg/dL Final   Triglycerides  Date Value Ref Range Status  05/17/2022 56 0 - 149 mg/dL Final         Failed - CBC within normal limits and completed in the last 12 months    WBC  Date Value Ref Range Status  08/09/2022 7.0 4.0 - 10.5 K/uL Final   RBC  Date Value Ref Range Status  08/09/2022 4.04 3.87 - 5.11 MIL/uL Final   Hemoglobin  Date Value Ref Range Status  08/09/2022 11.8 (L) 12.0 - 15.0 g/dL Final  16/09/9603 54.0 11.1 - 15.9 g/dL Final   HCT  Date Value Ref Range Status  08/09/2022 34.5 (L) 36.0 - 46.0 % Final   Hematocrit  Date Value Ref Range Status  05/17/2022 39.1 34.0 - 46.6 % Final   MCHC  Date Value Ref Range Status  08/09/2022 34.2 30.0 - 36.0 g/dL Final   [facility]  Date Value Ref Range Status  08/09/2022 29.2 26.0 - 34.0 pg Final   MCV  Date Value Ref Range Status  08/09/2022 85.4 80.0 - 100.0 fL  Final  05/17/2022 87 79 - 97 fL Final  02/05/2013 85 80 - 100 fL Final   No results found for: "PLTCOUNTKUC", "LABPLAT", "POCPLA" RDW  Date Value Ref Range Status  08/09/2022 12.1 11.5 - 15.5 % Final  05/17/2022 12.5 11.7 - 15.4 % Final  02/05/2013 13.0 11.5 - 14.5 % Final         Failed - CMP within normal limits and completed in the last 12 months    Albumin  Date Value Ref Range Status  08/09/2022 3.9 3.5 - 5.0 g/dL Final  98/10/9146 4.4 3.8 - 4.8 g/dL Final  82/95/6213 3.8 3.4 - 5.0 g/dL Final   Alkaline Phosphatase  Date Value Ref Range Status  08/09/2022 47 38 - 126 U/L Final  02/05/2013 91 50 - 136 Unit/L Final   ALT  Date Value Ref Range Status  08/09/2022 30 0 - 44 U/L Final   SGPT (ALT)  Date Value Ref Range Status  02/05/2013 45 12 - 78 U/L Final   AST  Date Value Ref Range Status  08/09/2022 19 15 - 41 U/L Final   SGOT(AST)  Date Value Ref Range Status  02/05/2013 29 15 - 37 Unit/L Final   BUN  Date Value Ref Range Status  08/09/2022 9 6 - 20 mg/dL Final  78/29/5621 7 6 - 24 mg/dL Final  30/86/5784 8 7 - 18 mg/dL Final   Calcium  Date Value Ref Range Status  08/09/2022 9.3 8.9 - 10.3 mg/dL Final   Calcium, Total  Date Value Ref Range Status  02/05/2013 9.1 8.5 - 10.1 mg/dL Final   CO2  Date Value Ref Range Status  08/09/2022 29 22 - 32 mmol/L Final   Co2  Date Value Ref Range Status  02/05/2013 30 21 - 32 mmol/L Final   Creatinine  Date Value Ref Range Status  02/05/2013 0.81 0.60 - 1.30 mg/dL Final   Creatinine, Ser  Date Value Ref Range Status  08/09/2022 0.74 0.44 - 1.00 mg/dL Final   Glucose  Date Value Ref Range Status  02/05/2013 121 (H) 65 - 99 mg/dL Final   Glucose, Bld  Date Value Ref Range Status  08/09/2022 116 (H) 70 - 99 mg/dL Final    Comment:    Glucose reference range applies only to samples taken after fasting for at least 8 hours.   Potassium  Date Value Ref Range Status  08/09/2022 3.9 3.5 - 5.1 mmol/L  Final  02/05/2013 3.5 3.5 - 5.1 mmol/L Final   Sodium  Date Value Ref Range Status  08/09/2022 141 135 - 145 mmol/L Final  05/17/2022 142 134 - 144 mmol/L Final  02/05/2013 140 136 - 145 mmol/L Final   Total Bilirubin  Date Value Ref Range Status  08/09/2022 0.8 0.3 - 1.2 mg/dL Final   Bilirubin,Total  Date Value Ref Range Status  02/05/2013 0.3 0.2 - 1.0 mg/dL Final   Bilirubin Total  Date Value Ref Range Status  05/17/2022 0.5 0.0 - 1.2 mg/dL Final   Protein, ur  Date Value Ref Range Status  11/01/2022 NEGATIVE NEGATIVE mg/dL Final   Total Protein  Date Value Ref Range Status  08/09/2022 6.7 6.5 - 8.1 g/dL Final  69/62/9528 7.1 6.0 - 8.5 g/dL Final  41/32/4401 7.8 6.4 - 8.2 g/dL Final   EGFR (African American)  Date Value Ref Range Status  02/05/2013 >60  Final   eGFR  Date Value Ref Range Status  05/17/2022 89 >59 mL/min/1.73 Final   EGFR (Non-African Amer.)  Date Value Ref Range Status  02/05/2013 >60  Final    Comment:    eGFR values <31mL/min/1.73 m2 may be an indication of chronic kidney disease (CKD). Calculated eGFR is useful in patients with stable renal function. The eGFR calculation will not be reliable in acutely ill patients when serum creatinine is changing rapidly. It is not useful in  patients on dialysis. The eGFR calculation may not be applicable to patients at the low and high extremes of body sizes, pregnant women, and vegetarians.    GFR, Estimated  Date Value Ref Range Status  08/09/2022 >60 >60 mL/min Final    Comment:    (NOTE) Calculated using the CKD-EPI Creatinine Equation (2021)          Passed - Completed PHQ-2 or PHQ-9 in the last 360 days      Passed - Last BP in normal range    BP Readings from Last 1 Encounters:  03/26/24 118/76         Passed - Last Heart Rate in normal range    Pulse Readings from Last 1 Encounters:  03/26/24 90  Passed - Valid encounter within last 6 months    Recent Outpatient  Visits           Today Bipolar 1 disorder [facility])   [facility] [place], [place]

## 2024-03-27 ENCOUNTER — Encounter: Payer: Self-pay | Admitting: Nurse Practitioner

## 2024-03-28 LAB — CBC WITH DIFFERENTIAL/PLATELET
Absolute Lymphocytes: 1918 {cells}/uL (ref 850–3900)
Absolute Monocytes: 340 {cells}/uL (ref 200–950)
Basophils Absolute: 41 {cells}/uL (ref 0–200)
Basophils Relative: 0.6 %
Eosinophils Absolute: 102 {cells}/uL (ref 15–500)
Eosinophils Relative: 1.5 %
HCT: 35.7 % (ref 35.0–45.0)
Hemoglobin: 12.2 g/dL (ref 11.7–15.5)
MCH: 29 pg (ref 27.0–33.0)
MCHC: 34.2 g/dL (ref 32.0–36.0)
MCV: 85 fL (ref 80.0–100.0)
MPV: 9.9 fL (ref 7.5–12.5)
Monocytes Relative: 5 %
Neutro Abs: 4400 {cells}/uL (ref 1500–7800)
Neutrophils Relative %: 64.7 %
Platelets: 265 10*3/uL (ref 140–400)
RBC: 4.2 10*6/uL (ref 3.80–5.10)
RDW: 13 % (ref 11.0–15.0)
Total Lymphocyte: 28.2 %
WBC: 6.8 10*3/uL (ref 3.8–10.8)

## 2024-03-28 LAB — COMPREHENSIVE METABOLIC PANEL WITH GFR
AG Ratio: 1.5 (calc) (ref 1.0–2.5)
ALT: 18 U/L (ref 6–29)
AST: 16 U/L (ref 10–30)
Albumin: 4.1 g/dL (ref 3.6–5.1)
Alkaline phosphatase (APISO): 78 U/L (ref 31–125)
BUN: 9 mg/dL (ref 7–25)
CO2: 28 mmol/L (ref 20–32)
Calcium: 9.1 mg/dL (ref 8.6–10.2)
Chloride: 102 mmol/L (ref 98–110)
Creat: 0.84 mg/dL (ref 0.50–0.99)
Globulin: 2.8 g/dL (ref 1.9–3.7)
Glucose, Bld: 105 mg/dL — ABNORMAL HIGH (ref 65–99)
Potassium: 4.3 mmol/L (ref 3.5–5.3)
Sodium: 136 mmol/L (ref 135–146)
Total Bilirubin: 0.4 mg/dL (ref 0.2–1.2)
Total Protein: 6.9 g/dL (ref 6.1–8.1)
eGFR: 88 mL/min/{1.73_m2} (ref >=60)

## 2024-03-28 LAB — LIPID PANEL
Cholesterol: 237 mg/dL — ABNORMAL HIGH (ref <200)
HDL: 49 mg/dL — ABNORMAL LOW (ref >=50)
LDL Cholesterol (Calc): 163 mg/dL — ABNORMAL HIGH
Non-HDL Cholesterol (Calc): 188 mg/dL — ABNORMAL HIGH (ref <130)
Total CHOL/HDL Ratio: 4.8 (calc) (ref <5.0)
Triglycerides: 129 mg/dL (ref <150)

## 2024-03-28 LAB — HEMOGLOBIN A1C
Hgb A1c MFr Bld: 5.9 %{Hb} — ABNORMAL HIGH (ref <5.7)
Mean Plasma Glucose: 123 mg/dL
eAG (mmol/L): 6.8 mmol/L

## 2024-03-28 LAB — CYCLIC CITRUL PEPTIDE ANTIBODY, IGG: Cyclic Citrullin Peptide Ab: <16 U

## 2024-03-28 LAB — RHEUMATOID FACTOR: Rheumatoid fact SerPl-aCnc: <10 [IU]/mL (ref <14)

## 2024-03-28 LAB — ANA: Anti Nuclear Antibody (ANA): NEGATIVE

## 2024-03-28 LAB — SEDIMENTATION RATE: Sed Rate: 25 mm/h — ABNORMAL HIGH (ref 0–20)

## 2024-03-28 LAB — C-REACTIVE PROTEIN: CRP: 7.5 mg/L (ref <8.0)

## 2024-03-29 NOTE — Telephone Encounter (Signed)
 PA done waiting on insurance to determine

## 2024-03-30 ENCOUNTER — Telehealth: Payer: Self-pay | Admitting: Nurse Practitioner

## 2024-03-30 NOTE — Telephone Encounter (Signed)
 Prior Auth from CoverMyMeds  cariprazine (VRAYLAR) 1.5 MG capsule   Key: [id number]

## 2024-03-30 NOTE — Telephone Encounter (Signed)
 Appealed PA

## 2024-03-30 NOTE — Telephone Encounter (Signed)
 It has been done waiting for Appeal to be reviewed.

## 2024-04-01 NOTE — Telephone Encounter (Signed)
 Nothing yet has been received after appealing it

## 2024-04-06 ENCOUNTER — Other Ambulatory Visit: Payer: Self-pay | Admitting: Nurse Practitioner

## 2024-04-06 DIAGNOSIS — K5904 Chronic idiopathic constipation: Secondary | ICD-10-CM

## 2024-04-06 MED ORDER — LINACLOTIDE 145 MCG PO CAPS: 145.0000 ug | ORAL_CAPSULE | Freq: Every day | ORAL | 0 refills | Status: DC

## 2024-04-23 ENCOUNTER — Ambulatory Visit: Admitting: Nurse Practitioner

## 2024-04-26 ENCOUNTER — Ambulatory Visit (INDEPENDENT_AMBULATORY_CARE_PROVIDER_SITE_OTHER): Admitting: Nurse Practitioner

## 2024-04-26 ENCOUNTER — Encounter: Payer: Self-pay | Admitting: Nurse Practitioner

## 2024-04-26 VITALS — BP 118/78 | HR 97 | Temp 98.1°F | Ht 65.0 in | Wt 175.4 lb

## 2024-04-26 DIAGNOSIS — E663 Overweight: Secondary | ICD-10-CM | POA: Diagnosis not present

## 2024-04-26 DIAGNOSIS — F319 Bipolar disorder, unspecified: Secondary | ICD-10-CM | POA: Diagnosis not present

## 2024-04-26 MED ORDER — WEGOVY 0.25 MG/0.5ML ~~LOC~~ SOAJ: 0.2500 mg | SUBCUTANEOUS | 0 refills | Status: DC

## 2024-04-26 NOTE — Progress Notes (Signed)
 BP 118/78   Pulse 97   Temp 98.1 F (36.7 C)   Ht 5\' 5"  (1.651 m)   Wt 175 lb 6.4 oz (79.6 kg)   LMP 12/04/2021 (Exact Date)   SpO2 98%   BMI 29.19 kg/m    Subjective:    Patient ID: Elizabeth Peters, female    DOB: [birth year], 43 y.o.   MRN: [id number]  HPI: Elizabeth Peters is a 43 y.o. female  Chief Complaint  Patient presents with   Medical Management of Chronic Issues    Discussed the use of AI scribe software for clinical note transcription with the patient, who gave verbal consent to proceed.  History of Present Illness Elizabeth Peters is a 43 year old female with bipolar disorder who presents for medication management and follow-up.  She was recently started on Vraylar , which she has been taking for less than two weeks due to delays in obtaining the medication. She has not experienced significant side effects, although the first two days were challenging but improved thereafter. She is also taking Seroquel 100 mg at bedtime.  She experiences chronic pain, which was evaluated by a pain management specialist who suggested fibromyalgia or muscle myalgia as potential causes. She is scheduled to start physical therapy soon and is awaiting a neurology appointment in June.  She has been unable to obtain her stomach medication due to insurance issues. She has started drinking mushroom coffee and takes a capful of Miralax daily, which she reports has been helpful.  She has recently put herself on a diet, eliminating sugar and carbs, and has lost seven pounds, going from 182 to 175 pounds. She follows a keto-carnivore diet but struggles with increased appetite due to Seroquel, which makes it challenging to lose belly fat. She is concerned about her family history of diabetes and is motivated to maintain a healthy diet to avoid developing the condition.  There is no family history of thyroid  cancer and no personal history of pancreatitis. She has not had a Dr. Kathlene Paradise since her last  visit, which she notes is a significant change for her.         04/26/2024    9:17 AM 03/26/2024    9:13 AM 01/16/2023    2:29 PM  Depression screen PHQ 2/9  Decreased Interest 1 3 0  Down, Depressed, Hopeless 1 1 0  PHQ - 2 Score 2 4 0  Altered sleeping 1 0 0  Tired, decreased energy 1 3 0  Change in appetite 0 3 0  Feeling bad or failure about yourself  0 1 0  Trouble concentrating 2 3 0  Moving slowly or fidgety/restless 1 3 0  Suicidal thoughts 0 0 0  PHQ-9 Score 7 17 0  Difficult doing work/chores  Somewhat difficult Not difficult at all       04/26/2024    9:18 AM 03/26/2024    9:14 AM 01/16/2023    2:29 PM 08/27/2022    3:01 PM  GAD 7 : Generalized Anxiety Score  Nervous, Anxious, on Edge 2 3 0 0  Control/stop worrying 1 1 0 0  Worry too much - different things 1 1 0 0  Trouble relaxing 2 3 0 3  Restless 2 1 0 3  Easily annoyed or irritable 1 3 0 1  Afraid - awful might happen 1 0 0 0  Total GAD 7 Score 10 12 0 7  Anxiety Difficulty  Somewhat difficult Not difficult at all Somewhat  difficult     Relevant past medical, surgical, family and social history reviewed and updated as indicated. Interim medical history since our last visit reviewed. Allergies and medications reviewed and updated.  Review of Systems  Constitutional: Negative for fever or weight change.  Respiratory: Negative for cough and shortness of breath.   Cardiovascular: Negative for chest pain or palpitations.  Gastrointestinal: Negative for abdominal pain, no bowel changes.  Musculoskeletal: Negative for gait problem or joint swelling.  Skin: Negative for rash.  Neurological: Negative for dizziness or headache.  No other specific complaints in a complete review of systems (except as listed in HPI above).      Objective:      BP 118/78   Pulse 97   Temp 98.1 F (36.7 C)   Ht 5\' 5"  (1.651 m)   Wt 175 lb 6.4 oz (79.6 kg)   LMP 12/04/2021 (Exact Date)   SpO2 98%   BMI 29.19 kg/m     Wt  Readings from Last 3 Encounters:  04/26/24 175 lb 6.4 oz (79.6 kg)  03/26/24 182 lb 12.8 oz (82.9 kg)  01/16/23 141 lb 14.4 oz (64.4 kg)    Physical Exam Vitals reviewed.  Constitutional:      Appearance: Normal appearance.  HENT:     Head: Normocephalic.  Cardiovascular:     Rate and Rhythm: Normal rate and regular rhythm.  Pulmonary:     Effort: Pulmonary effort is normal.     Breath sounds: Normal breath sounds.  Musculoskeletal:        General: Normal range of motion.  Skin:    General: Skin is warm and dry.  Neurological:     General: No focal deficit present.     Mental Status: She is alert and oriented to person, place, and time. Mental status is at baseline.  Psychiatric:        Mood and Affect: Mood normal.        Behavior: Behavior normal.        Thought Content: Thought content normal.        Judgment: Judgment normal.    Physical Exam    Results for orders placed or performed in visit on 03/26/24  ANA   Collection Time: 03/26/24 10:08 AM  Result Value Ref Range   Anti Nuclear Antibody (ANA) NEGATIVE NEGATIVE  CBC with Differential/Platelet   Collection Time: 03/26/24 10:08 AM  Result Value Ref Range   WBC 6.8 3.8 - 10.8 Thousand/uL   RBC 4.20 3.80 - 5.10 Million/uL   Hemoglobin 12.2 11.7 - 15.5 g/dL   HCT 47.8 29.5 - 62.1 %   MCV 85.0 80.0 - 100.0 fL   MCH 29.0 27.0 - 33.0 pg   MCHC 34.2 32.0 - 36.0 g/dL   RDW 30.8 65.7 - 84.6 %   Platelets 265 140 - 400 Thousand/uL   MPV 9.9 7.5 - 12.5 fL   Neutro Abs 4,400 1,500 - 7,800 cells/uL   Absolute Lymphocytes 1,918 850 - 3,900 cells/uL   Absolute Monocytes 340 200 - 950 cells/uL   Eosinophils Absolute 102 15 - 500 cells/uL   Basophils Absolute 41 0 - 200 cells/uL   Neutrophils Relative % 64.7 %   Total Lymphocyte 28.2 %   Monocytes Relative 5.0 %   Eosinophils Relative 1.5 %   Basophils Relative 0.6 %  Comprehensive metabolic panel with GFR   Collection Time: 03/26/24 10:08 AM  Result Value Ref  Range   Glucose, Bld 105 (H) 65 -  99 mg/dL   BUN 9 7 - 25 mg/dL   Creat 1.61 0.96 - 0.45 mg/dL   eGFR 88 > OR = 60 WU/JWJ/1.91Y7   BUN/Creatinine Ratio SEE NOTE: 6 - 22 (calc)   Sodium 136 135 - 146 mmol/L   Potassium 4.3 3.5 - 5.3 mmol/L   Chloride 102 98 - 110 mmol/L   CO2 28 20 - 32 mmol/L   Calcium 9.1 8.6 - 10.2 mg/dL   Total Protein 6.9 6.1 - 8.1 g/dL   Albumin 4.1 3.6 - 5.1 g/dL   Globulin 2.8 1.9 - 3.7 g/dL (calc)   AG Ratio 1.5 1.0 - 2.5 (calc)   Total Bilirubin 0.4 0.2 - 1.2 mg/dL   Alkaline phosphatase (APISO) 78 31 - 125 U/L   AST 16 10 - 30 U/L   ALT 18 6 - 29 U/L  C-reactive protein   Collection Time: 03/26/24 10:08 AM  Result Value Ref Range   CRP 7.5 <8.0 mg/L  Sedimentation rate   Collection Time: 03/26/24 10:08 AM  Result Value Ref Range   Sed Rate 25 (H) 0 - 20 mm/h  Lipid panel   Collection Time: 03/26/24 10:08 AM  Result Value Ref Range   Cholesterol 237 (H) <200 mg/dL   HDL 49 (L) > OR = 50 mg/dL   Triglycerides 829 <562 mg/dL   LDL Cholesterol (Calc) 163 (H) mg/dL (calc)   Total CHOL/HDL Ratio 4.8 <5.0 (calc)   Non-HDL Cholesterol (Calc) 188 (H) <130 mg/dL (calc)  Hemoglobin Z3Y   Collection Time: 03/26/24 10:08 AM  Result Value Ref Range   Hgb A1c MFr Bld 5.9 (H) <5.7 % of total Hgb   Mean Plasma Glucose 123 mg/dL   eAG (mmol/L) 6.8 mmol/L  Rheumatoid Factor   Collection Time: 03/26/24 10:08 AM  Result Value Ref Range   Rheumatoid fact SerPl-aCnc <10 <14 IU/mL  Cyclic citrul peptide antibody, IgG   Collection Time: 03/26/24 10:08 AM  Result Value Ref Range   Cyclic Citrullin Peptide Ab <86 UNITS           Assessment & Plan:   Problem List Items Addressed This Visit       Other   Bipolar 1 disorder (HCC) - Primary   Other Visit Diagnoses       Overweight (BMI 25.0-29.9)       Relevant Medications   WEGOVY 0.25 MG/0.5ML SOAJ        Assessment and Plan Assessment & Plan Weight management Weight management with recent  weight loss of 7 pounds following a no sugar, no carb diet. She is unable to attend the gym due to financial constraints and is seeking additional weight loss options. Discussed potential use of weight loss injections (Wegovy or Zepbound) to aid in further weight reduction. No personal history of pancreatitis or family history of thyroid  cancer, which are relevant considerations for these medications. Explained the mechanism of action of the injections, which slow gastric emptying to promote satiety, and discussed potential side effects including abdominal pain, constipation, fatigue, nausea, and vomiting. Emphasized that side effects vary among individuals and that the medication can be discontinued if adverse effects occur. She is motivated to avoid diabetes, which is prevalent in her family. - Initiate prior authorization for weight loss injections (Wegovy or Zepbound) - Continue current dietary modifications (no sugar, no carbs)  Bipolar disorder Bipolar disorder managed with Seroquel 100 mg at bedtime  Recently started on Vraylar , with no significant side effects reported after initial adjustment  period. She has not yet been contacted by psychiatry for follow-up, but will be provided with contact information to facilitate appointment scheduling. Discussed the importance of completing pre-appointment paperwork for psychiatry to avoid appointment cancellation. - Provide contact information for psychiatry - Ensure completion of psychiatry pre-appointment paperwork        Follow up plan: Return in about 3 months (around 07/27/2024) for follow up.

## 2024-04-28 ENCOUNTER — Encounter: Payer: Self-pay | Admitting: Nurse Practitioner

## 2024-04-28 ENCOUNTER — Other Ambulatory Visit: Payer: Self-pay | Admitting: Physical Medicine & Rehabilitation

## 2024-04-28 ENCOUNTER — Telehealth: Payer: Self-pay | Admitting: Pharmacy Technician

## 2024-04-28 ENCOUNTER — Other Ambulatory Visit (HOSPITAL_COMMUNITY): Payer: Self-pay

## 2024-04-28 DIAGNOSIS — M5412 Radiculopathy, cervical region: Secondary | ICD-10-CM

## 2024-04-28 NOTE — Telephone Encounter (Signed)
 Pharmacy Patient Advocate Encounter   Received notification from Patient Advice Request messages that prior authorization for WEGOVY 0.25 MG is required/requested.   Insurance verification completed.   The patient is insured through [facility] MEDICAID .   Per test claim: PA required; PA submitted to above mentioned insurance via CoverMyMeds Key/confirmation #/EOC [facility] Status is pending

## 2024-04-28 NOTE — Telephone Encounter (Signed)
 PA request has been Submitted. New Encounter has been or will be created for follow up. For additional info see Pharmacy Prior Auth telephone encounter from 04/28/24.

## 2024-04-29 ENCOUNTER — Other Ambulatory Visit (HOSPITAL_COMMUNITY): Payer: Self-pay

## 2024-04-30 ENCOUNTER — Other Ambulatory Visit (HOSPITAL_COMMUNITY): Payer: Self-pay

## 2024-05-03 ENCOUNTER — Other Ambulatory Visit (HOSPITAL_COMMUNITY): Payer: Self-pay

## 2024-05-03 NOTE — Telephone Encounter (Signed)
 Good afternoon, I can assure you that I submitted PA for Wegovy  and it's still pending. I actually had to send it in  twice because CMM was having difficulties with their system and so I had to manually fill out the form and fax it in to her insurance.

## 2024-05-04 ENCOUNTER — Other Ambulatory Visit (HOSPITAL_COMMUNITY): Payer: Self-pay

## 2024-05-06 ENCOUNTER — Encounter: Payer: Self-pay | Admitting: Nurse Practitioner

## 2024-05-06 ENCOUNTER — Other Ambulatory Visit: Payer: Self-pay | Admitting: Nurse Practitioner

## 2024-05-06 DIAGNOSIS — F319 Bipolar disorder, unspecified: Secondary | ICD-10-CM

## 2024-05-06 MED ORDER — QUETIAPINE FUMARATE 100 MG PO TABS: 100.0000 mg | ORAL_TABLET | Freq: Every day | ORAL | 0 refills | Status: DC

## 2024-05-11 ENCOUNTER — Other Ambulatory Visit (HOSPITAL_COMMUNITY): Payer: Self-pay

## 2024-05-11 NOTE — Telephone Encounter (Signed)
 Pharmacy Patient Advocate Encounter  Received notification from [facility] MEDICAID that Prior Authorization for WEGOVY  0.25 MG has been DENIED.  Full denial letter will be uploaded to the media tab. See denial reason below. BMI has to be more than or equal to 30 kg/m2   PA #/Case ID/Reference #: [facility]

## 2024-05-12 ENCOUNTER — Ambulatory Visit: Payer: Self-pay | Admitting: Psychiatry

## 2024-05-31 DIAGNOSIS — R5383 Other fatigue: Secondary | ICD-10-CM | POA: Diagnosis not present

## 2024-05-31 DIAGNOSIS — Z87898 Personal history of other specified conditions: Secondary | ICD-10-CM | POA: Diagnosis not present

## 2024-05-31 DIAGNOSIS — G8928 Other chronic postprocedural pain: Secondary | ICD-10-CM | POA: Diagnosis not present

## 2024-05-31 DIAGNOSIS — G479 Sleep disorder, unspecified: Secondary | ICD-10-CM | POA: Diagnosis not present

## 2024-05-31 DIAGNOSIS — R200 Anesthesia of skin: Secondary | ICD-10-CM | POA: Diagnosis not present

## 2024-05-31 DIAGNOSIS — R202 Paresthesia of skin: Secondary | ICD-10-CM | POA: Diagnosis not present

## 2024-05-31 DIAGNOSIS — R531 Weakness: Secondary | ICD-10-CM | POA: Diagnosis not present

## 2024-05-31 DIAGNOSIS — Z1331 Encounter for screening for depression: Secondary | ICD-10-CM | POA: Diagnosis not present

## 2024-05-31 DIAGNOSIS — R413 Other amnesia: Secondary | ICD-10-CM | POA: Diagnosis not present

## 2024-06-13 ENCOUNTER — Encounter: Payer: Self-pay | Admitting: Nurse Practitioner

## 2024-06-21 ENCOUNTER — Other Ambulatory Visit: Payer: Self-pay | Admitting: Nurse Practitioner

## 2024-06-21 ENCOUNTER — Encounter: Payer: Self-pay | Admitting: Nurse Practitioner

## 2024-06-21 ENCOUNTER — Ambulatory Visit (INDEPENDENT_AMBULATORY_CARE_PROVIDER_SITE_OTHER): Admitting: Nurse Practitioner

## 2024-06-21 VITALS — BP 116/66 | HR 98 | Resp 18 | Ht 65.0 in | Wt 175.2 lb

## 2024-06-21 DIAGNOSIS — K5904 Chronic idiopathic constipation: Secondary | ICD-10-CM | POA: Diagnosis not present

## 2024-06-21 DIAGNOSIS — E663 Overweight: Secondary | ICD-10-CM | POA: Insufficient documentation

## 2024-06-21 MED ORDER — ZEPBOUND 2.5 MG/0.5ML ~~LOC~~ SOAJ: 2.5000 mg | SUBCUTANEOUS | 0 refills | Status: DC

## 2024-06-21 MED ORDER — LINACLOTIDE 145 MCG PO CAPS: 145.0000 ug | ORAL_CAPSULE | Freq: Every day | ORAL | 0 refills | Status: DC

## 2024-06-21 NOTE — Progress Notes (Signed)
 BP 116/66   Pulse 98   Resp 18   Ht 5' 5 (1.651 m)   Wt 175 lb 3.2 oz (79.5 kg)   LMP 12/04/2021 (Exact Date)   SpO2 99%   BMI 29.15 kg/m    Subjective:    Patient ID: Elizabeth Peters, female    DOB: 1981-08-03, 43 y.o.   MRN: [id number]  HPI: Elizabeth Peters is a 43 y.o. female  Chief Complaint  Patient presents with   Obesity    Ins did not cover wegovy  wants to try phentermine    Discussed the use of AI scribe software for clinical note transcription with the patient, who gave verbal consent to proceed.  History of Present Illness Elizabeth Peters is a 43 year old female who presents for weight management.  She has been actively working on her diet and lifestyle, including cutting out sugar and following a low-carb plan, but has not experienced any weight loss. She has also stopped drinking Dr. Nunzio and has been fasting. Previously, she was denied Therapist, occupational for Wegovy  and mentioned that her insurance would approve Mounjaro and Zepbound  instead.  She expresses significant dissatisfaction with her weight, stating, 'I'm just so unhappy with myself and my weight that I can't stand it.'  She is currently on nortriptyline and Cymbalta , which have been effective for her pain and mental health, as prescribed by her neurologist. She previously took Vraylar  but has since stopped.  She reports chronic constipation, stating she can go weeks without a bowel movement. She previously tried Miralax and mushroom coffee without success. Her insurance did not cover Linzess , which was previously attempted. She has had upper and lower GI evaluations, including colonoscopy, with no resolution of her symptoms.     Starting weight: 175 lbs Starting BMI: 29.15 Waist Measurement : 37.5 inches  - Encourage continuation of lifestyle modifications, including dietary management and regular exercise. -continue to increase physical activity, getting at least 150 min of physical activity a  week.  Work on including Runner, broadcasting/film/video 2 days a week.  - continue eating at a calorie deficit 1400-1600 cal a day, eating a well balanced diet with whole foods, avoiding processed foods.    -make sure you are drinking plenty of water  and eating fiber    04/26/2024    9:17 AM 03/26/2024    9:13 AM 01/16/2023    2:29 PM  Depression screen PHQ 2/9  Decreased Interest 1 3 0  Down, Depressed, Hopeless 1 1 0  PHQ - 2 Score 2 4 0  Altered sleeping 1 0 0  Tired, decreased energy 1 3 0  Change in appetite 0 3 0  Feeling bad or failure about yourself  0 1 0  Trouble concentrating 2 3 0  Moving slowly or fidgety/restless 1 3 0  Suicidal thoughts 0 0 0  PHQ-9 Score 7 17 0  Difficult doing work/chores  Somewhat difficult Not difficult at all    Relevant past medical, surgical, family and social history reviewed and updated as indicated. Interim medical history since our last visit reviewed. Allergies and medications reviewed and updated.  Review of Systems  Constitutional: Negative for fever or weight change.  Respiratory: Negative for cough and shortness of breath.   Cardiovascular: Negative for chest pain or palpitations.  Gastrointestinal: Negative for abdominal pain, no bowel changes.  Musculoskeletal: Negative for gait problem or joint swelling.  Skin: Negative for rash.  Neurological: Negative for dizziness or headache.  No other  specific complaints in a complete review of systems (except as listed in HPI above).      Objective:     BP 116/66   Pulse 98   Resp 18   Ht 5' 5 (1.651 m)   Wt 175 lb 3.2 oz (79.5 kg)   LMP 12/04/2021 (Exact Date)   SpO2 99%   BMI 29.15 kg/m    Wt Readings from Last 3 Encounters:  06/21/24 175 lb 3.2 oz (79.5 kg)  04/26/24 175 lb 6.4 oz (79.6 kg)  03/26/24 182 lb 12.8 oz (82.9 kg)    Physical Exam Physical Exam MEASUREMENTS: Weight- 175, BMI- 29.15. GENERAL: Alert, cooperative, well developed, no acute distress. HEENT: Normocephalic,  normal oropharynx, moist mucous membranes. CHEST: Clear to auscultation bilaterally, no wheezes, rhonchi, or crackles. CARDIOVASCULAR: Normal heart rate and rhythm, S1 and S2 normal without murmurs. ABDOMEN: Soft, non-tender, non-distended, without organomegaly, normal bowel sounds. EXTREMITIES: No cyanosis or edema. NEUROLOGICAL: Cranial nerves grossly intact, moves all extremities without gross motor or sensory deficit.   Results for orders placed or performed in visit on 03/26/24  ANA   Collection Time: 03/26/24 10:08 AM  Result Value Ref Range   Anti Nuclear Antibody (ANA) NEGATIVE NEGATIVE  CBC with Differential/Platelet   Collection Time: 03/26/24 10:08 AM  Result Value Ref Range   WBC 6.8 3.8 - 10.8 Thousand/uL   RBC 4.20 3.80 - 5.10 Million/uL   Hemoglobin 12.2 11.7 - 15.5 g/dL   HCT 64.2 64.9 - 54.9 %   MCV 85.0 80.0 - 100.0 fL   MCH 29.0 27.0 - 33.0 pg   MCHC 34.2 32.0 - 36.0 g/dL   RDW 86.9 88.9 - 84.9 %   Platelets 265 140 - 400 Thousand/uL   MPV 9.9 7.5 - 12.5 fL   Neutro Abs 4,400 1,500 - 7,800 cells/uL   Absolute Lymphocytes 1,918 850 - 3,900 cells/uL   Absolute Monocytes 340 200 - 950 cells/uL   Eosinophils Absolute 102 15 - 500 cells/uL   Basophils Absolute 41 0 - 200 cells/uL   Neutrophils Relative % 64.7 %   Total Lymphocyte 28.2 %   Monocytes Relative 5.0 %   Eosinophils Relative 1.5 %   Basophils Relative 0.6 %  Comprehensive metabolic panel with GFR   Collection Time: 03/26/24 10:08 AM  Result Value Ref Range   Glucose, Bld 105 (H) 65 - 99 mg/dL   BUN 9 7 - 25 mg/dL   Creat 9.15 9.49 - 9.00 mg/dL   eGFR 88 > OR = 60 fO/fpw/8.26f7   BUN/Creatinine Ratio SEE NOTE: 6 - 22 (calc)   Sodium 136 135 - 146 mmol/L   Potassium 4.3 3.5 - 5.3 mmol/L   Chloride 102 98 - 110 mmol/L   CO2 28 20 - 32 mmol/L   Calcium 9.1 8.6 - 10.2 mg/dL   Total Protein 6.9 6.1 - 8.1 g/dL   Albumin 4.1 3.6 - 5.1 g/dL   Globulin 2.8 1.9 - 3.7 g/dL (calc)   AG Ratio 1.5 1.0 -  2.5 (calc)   Total Bilirubin 0.4 0.2 - 1.2 mg/dL   Alkaline phosphatase (APISO) 78 31 - 125 U/L   AST 16 10 - 30 U/L   ALT 18 6 - 29 U/L  C-reactive protein   Collection Time: 03/26/24 10:08 AM  Result Value Ref Range   CRP 7.5 <8.0 mg/L  Sedimentation rate   Collection Time: 03/26/24 10:08 AM  Result Value Ref Range   Sed Rate 25 (H) 0 -  20 mm/h  Lipid panel   Collection Time: 03/26/24 10:08 AM  Result Value Ref Range   Cholesterol 237 (H) <200 mg/dL   HDL 49 (L) > OR = 50 mg/dL   Triglycerides 870 <849 mg/dL   LDL Cholesterol (Calc) 163 (H) mg/dL (calc)   Total CHOL/HDL Ratio 4.8 <5.0 (calc)   Non-HDL Cholesterol (Calc) 188 (H) <130 mg/dL (calc)  Hemoglobin J8r   Collection Time: 03/26/24 10:08 AM  Result Value Ref Range   Hgb A1c MFr Bld 5.9 (H) <5.7 % of total Hgb   Mean Plasma Glucose 123 mg/dL   eAG (mmol/L) 6.8 mmol/L  Rheumatoid Factor   Collection Time: 03/26/24 10:08 AM  Result Value Ref Range   Rheumatoid fact SerPl-aCnc <10 <14 IU/mL  Cyclic citrul peptide antibody, IgG   Collection Time: 03/26/24 10:08 AM  Result Value Ref Range   Cyclic Citrullin Peptide Ab <83 UNITS          Assessment & Plan:   Problem List Items Addressed This Visit       Other   Overweight (BMI 25.0-29.9) - Primary   Relevant Medications   tirzepatide  (ZEPBOUND ) 2.5 MG/0.5ML Pen   Other Visit Diagnoses       Chronic idiopathic constipation       Relevant Medications   linaclotide  (LINZESS ) 145 MCG CAPS capsule        Assessment and Plan Assessment & Plan Overweight BMI is 29.15, categorizing her as overweight. Previous attempts to obtain Wegovy  were denied by insurance. She is implementing dietary and lifestyle modifications, including eliminating sugar and adhering to a low-carb regimen. Phentermine is contraindicated due to bipolar disorder. Insurance may approve Zepbound  or Mounjaro, which are the same drug but listed for different indications (weight loss and  diabetes). - Attempt to obtain approval for Zepbound  for weight management.  Chronic constipation Reports chronic constipation with infrequent bowel movements, sometimes going weeks without a bowel movement. Previous attempts to use Linzess  were not covered by insurance. She has not had success with GI consultations or previous interventions, including Miralax and mushroom coffee. - Provide a coupon for Linzess  to reduce cost and facilitate trial. - Instruct her to try Linzess  with coupon and report back on effectiveness.  Bipolar disorder Bipolar disorder is well-managed with nortriptyline and Cymbalta . Phentermine is contraindicated due to its stimulant properties, which could exacerbate bipolar symptoms.        Follow up plan: Return in about 3 months (around 09/21/2024) for follow up.

## 2024-06-23 ENCOUNTER — Encounter: Payer: Self-pay | Admitting: Nurse Practitioner

## 2024-06-23 ENCOUNTER — Ambulatory Visit: Admitting: Nurse Practitioner

## 2024-06-23 NOTE — Telephone Encounter (Signed)
 Requested medication (s) are due for refill today: No  Requested medication (s) are on the active medication list: Yes  Last refill:  06/21/24  Future visit scheduled:   Notes to clinic:  See pharmacy request.    Requested Prescriptions  Pending Prescriptions Disp Refills   ZEPBOUND  2.5 MG/0.5ML Pen [Pharmacy Med Name: ZEPBOUND  2.5 MG/0.5 ML PEN]  0    Sig: INJECT 2.5 MG SUBCUTANEOUSLY WEEKLY     Off-Protocol Failed - 06/23/2024  3:19 PM      Failed - Medication not assigned to a protocol, review manually.      Passed - Valid encounter within last 12 months    Recent Outpatient Visits           2 days ago Overweight (BMI 25.0-29.9)   [facility] Gareth Mliss F[place], FNP   1 month ago Bipolar 1 disorder [facility])   [facility] Gareth Mliss FAL[place], FNP   2 months ago Bipolar 1 disorder [facility])   [facility] Gareth Mliss FALCON, [place]

## 2024-06-28 ENCOUNTER — Ambulatory Visit (INDEPENDENT_AMBULATORY_CARE_PROVIDER_SITE_OTHER): Payer: Self-pay | Admitting: Psychiatry

## 2024-06-28 ENCOUNTER — Encounter: Payer: Self-pay | Admitting: Psychiatry

## 2024-06-28 VITALS — BP 118/80 | HR 99 | Temp 98.6°F | Ht 65.0 in | Wt 174.2 lb

## 2024-06-28 DIAGNOSIS — F419 Anxiety disorder, unspecified: Secondary | ICD-10-CM

## 2024-06-28 DIAGNOSIS — Z79899 Other long term (current) drug therapy: Secondary | ICD-10-CM | POA: Insufficient documentation

## 2024-06-28 DIAGNOSIS — F439 Reaction to severe stress, unspecified: Secondary | ICD-10-CM | POA: Diagnosis not present

## 2024-06-28 DIAGNOSIS — F3132 Bipolar disorder, current episode depressed, moderate: Secondary | ICD-10-CM | POA: Insufficient documentation

## 2024-06-28 MED ORDER — QUETIAPINE FUMARATE 100 MG PO TABS
100.0000 mg | ORAL_TABLET | Freq: Every day | ORAL | 1 refills | Status: DC
Start: 2024-06-28 — End: 2024-09-19

## 2024-06-28 NOTE — Patient Instructions (Signed)
  [email or web address]  [email or web address]   [employer] [email or web address] [address], [place], [place] [address]  [phone]  [facility] [email or web address] [address] [place], [place] [address]  [phone]   [facility] - [phone]  [facility] - [phone]  [facility] - [phone] counseling [phone]  [facility] [phone]   Medicaid below :  [facility] [address] [place], [place] [address]  [phone]    Elizabeth Peters [address] [place], [place] [address]  [phone]    [facility] [address] [place], [place] [address]  [phone]
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
## 2024-07-05 DIAGNOSIS — R200 Anesthesia of skin: Secondary | ICD-10-CM | POA: Diagnosis not present

## 2024-07-06 ENCOUNTER — Other Ambulatory Visit (HOSPITAL_COMMUNITY): Payer: Self-pay

## 2024-07-06 DIAGNOSIS — R200 Anesthesia of skin: Secondary | ICD-10-CM | POA: Diagnosis not present

## 2024-07-06 NOTE — Telephone Encounter (Signed)
 Not covered, Plan/Benefit Exclusion

## 2024-07-14 ENCOUNTER — Other Ambulatory Visit (HOSPITAL_COMMUNITY): Payer: Self-pay

## 2024-07-26 ENCOUNTER — Ambulatory Visit: Admitting: Nurse Practitioner

## 2024-07-30 ENCOUNTER — Encounter: Payer: Self-pay | Admitting: Nurse Practitioner

## 2024-07-30 ENCOUNTER — Other Ambulatory Visit: Payer: Self-pay | Admitting: Nurse Practitioner

## 2024-07-30 DIAGNOSIS — M5030 Other cervical disc degeneration, unspecified cervical region: Secondary | ICD-10-CM

## 2024-07-30 MED ORDER — TIZANIDINE HCL 2 MG PO TABS: 2.0000 mg | ORAL_TABLET | Freq: Three times a day (TID) | ORAL | 1 refills | Status: AC | PRN

## 2024-08-30 ENCOUNTER — Ambulatory Visit: Admitting: Psychiatry

## 2024-09-15 ENCOUNTER — Encounter: Payer: Self-pay | Admitting: Nurse Practitioner

## 2024-09-15 ENCOUNTER — Other Ambulatory Visit: Payer: Self-pay | Admitting: Nurse Practitioner

## 2024-09-15 DIAGNOSIS — M797 Fibromyalgia: Secondary | ICD-10-CM

## 2024-09-18 ENCOUNTER — Other Ambulatory Visit: Payer: Self-pay | Admitting: Psychiatry

## 2024-09-18 DIAGNOSIS — F419 Anxiety disorder, unspecified: Secondary | ICD-10-CM

## 2024-09-19 ENCOUNTER — Telehealth: Payer: Self-pay | Admitting: Psychiatry

## 2024-09-19 NOTE — Telephone Encounter (Signed)
 Patient needs an appointment

## 2024-09-20 NOTE — Telephone Encounter (Signed)
Yes.  30 minutes please.

## 2024-10-20 ENCOUNTER — Telehealth: Payer: Self-pay | Admitting: Psychiatry

## 2024-10-20 ENCOUNTER — Other Ambulatory Visit: Payer: Self-pay | Admitting: Nurse Practitioner

## 2024-10-20 ENCOUNTER — Encounter: Payer: Self-pay | Admitting: Psychiatry

## 2024-10-20 DIAGNOSIS — F3176 Bipolar disorder, in full remission, most recent episode depressed: Secondary | ICD-10-CM

## 2024-10-20 DIAGNOSIS — K5904 Chronic idiopathic constipation: Secondary | ICD-10-CM

## 2024-10-20 DIAGNOSIS — F439 Reaction to severe stress, unspecified: Secondary | ICD-10-CM

## 2024-10-20 DIAGNOSIS — F419 Anxiety disorder, unspecified: Secondary | ICD-10-CM | POA: Insufficient documentation

## 2024-10-20 NOTE — Progress Notes (Signed)
 Virtual Visit via Video Note  I connected with Elizabeth Peters on 10/20/24 at 10:00 AM EST by a video enabled telemedicine application and verified that I am speaking with the correct person using two identifiers.  Location Provider Location : [facility] Patient Location : Car  Participants: Patient , Provider    I discussed the limitations of evaluation and management by telemedicine and the availability of in person appointments. The patient expressed understanding and agreed to proceed.   I discussed the assessment and treatment plan with the patient. The patient was provided an opportunity to ask questions and all were answered. The patient agreed with the plan and demonstrated an understanding of the instructions.   The patient was advised to call back or seek an in-person evaluation if the symptoms worsen or if the condition fails to improve as anticipated.   BH MD OP Progress Note  10/20/2024 4:29 PM Elizabeth Peters  MRN:  [id number]  Chief Complaint:  Chief Complaint  Patient presents with   Follow-up   Medication Refill   Anxiety   Mood disorder   Discussed the use of AI scribe software for clinical note transcription with the patient, who gave verbal consent to proceed.  History of Present Illness Elizabeth Peters is a 43 year old American Indian female, retired emergency planning/management officer, lives in [place] with girlfriend, divorced, has a history of bipolar disorder, anxiety, overweight, prediabetes, constipation, brain tumor status post surgery was evaluated by telemedicine today for a follow-up appointment.  Since her last appointment in July, she reports feeling great and describes this as the best she has felt in her entire life. She credits her improvement to consistently taking her medications, including Seroquel , Cymbalta  20 mg prescribed by neurology, and nortriptyline. She notes that she recently refilled her Seroquel  and plans to pick it up.  She denies any side effects to her  medications.  She denies experiencing current suicidal thoughts, homicidality or perceptual disturbances.  She denies any sadness, anhedonia or significant anxiety.  She reports sleep and appetite is fair.  She denies any current trauma related symptoms including flashbacks nightmares or intrusive memories.  She has not had any recent panic attacks.  She states that when she encounters panic-inducing situations, she manages them effectively and does not become easily panicked.  She continues to experience ongoing memory issues, such as forgetfulness and difficulty finding words, even as her mood symptoms have improved. She reports discussing these symptoms with her neurologist.  She reports neurology discussed pseudodementia.  However since her mood symptoms have improved her memory still has not improved , she hence agrees to follow-up with neurology for further management as needed.  She denies obsessions or compulsive behaviors aside from a preference for neatness and states that these behaviors do not interfere with her ability to keep up with appointments or other obligations.      Visit Diagnosis:    ICD-10-CM   1. Bipolar disorder, in full remission, most recent episode depressed  F31.76    moderate    2. Trauma and stressor-related disorder  F43.9    Unspecified, rule out PTSD    3. Anxiety disorder, unspecified type  F41.9       Past Psychiatric History: I have reviewed past psychiatric history from progress note on 06/28/2024.  Past trials of Vraylar .  Past Medical History:  Past Medical History:  Diagnosis Date   Anxiety    Brain tumor (benign) (HCC) 2011   Chronic pain due to injury  spine   Endometriosis    Family history of adverse reaction to anesthesia    Mother and sister - PONV   PONV (postoperative nausea and vomiting)    after hysterectomy   Wears hearing aid in left ear     Past Surgical History:  Procedure Laterality Date   BIOPSY N/A 04/18/2022    Procedure: BIOPSY;  colonoscopySurgeon: Jinny Carmine, MD;  Location: [facility];  Service: Endoscopy;  Laterality: N/A;   BRAIN SURGERY  2011   tumor   CHOLECYSTECTOMY     COLONOSCOPY N/A 04/18/2022   Procedure: COLONOSCOPY;  Surgeon: Jinny Carmine, MD;  Location: [facility];  Service: Endoscopy;  Laterality: N/A;   CYSTOSCOPY N/A 01/03/2022   Procedure: CYSTOSCOPY;  Surgeon: Arloa Lamar SQUIBB, MD;  Location: [facility];  Service: Gynecology;  Laterality: N/A;   DILATION AND CURETTAGE OF UTERUS  2007   ESOPHAGOGASTRODUODENOSCOPY N/A 04/18/2022   Procedure: ESOPHAGOGASTRODUODENOSCOPY (EGD);  Surgeon: Jinny Carmine, MD;  Location: [facility];  Service: Endoscopy;  Laterality: N/A;   neck fusion     C6-C7 fusion   SHOULDER SURGERY Left 2011   SPINE SURGERY  2013   neck fulsion   TONSILLECTOMY  2008   TOTAL LAPAROSCOPIC HYSTERECTOMY WITH BILATERAL SALPINGO OOPHORECTOMY N/A 01/03/2022   Procedure: TOTAL LAPAROSCOPIC HYSTERECTOMY WITH BILATERAL SALPINGECTOMY, LEFT OOPHORECTOMY;  Surgeon: Arloa Lamar SQUIBB, MD;  Location: [facility];  Service: Gynecology;  Laterality: N/A;    Family Psychiatric History: I have reviewed family psychiatric history from progress note on 06/28/2024.  Family History:  Family History  Problem Relation Age of Onset   Diabetes Mother    Cancer Mother    Breast cancer Mother 75   Mental illness Mother    Cancer Father    Skin cancer Father    Heart attack Father    Mental illness Sister    Dementia Maternal Grandmother    Cancer Paternal Grandfather    Lung cancer Paternal Grandfather    Stroke Paternal Grandmother    Diabetes Paternal Grandmother    Cancer Paternal Grandmother    Colon cancer Paternal Grandmother    Breast cancer Cousin        maternal side early 56's    Social History: I have reviewed social history from progress note on 06/28/2024. Social History   Socioeconomic History   Marital status: Divorced    Spouse name:  Not on file   Number of children: 0   Years of education: Not on file   Highest education level: High school graduate  Occupational History   Not on file  Tobacco Use   Smoking status: Former    Current packs/day: 0.00    Types: Cigarettes    Start date: 12/16/2008    Quit date: 12/16/2012    Years since quitting: 11.8   Smokeless tobacco: Never  Vaping Use   Vaping status: Never Used  Substance and Sexual Activity   Alcohol use: No    Alcohol/week: 0.0 standard drinks of alcohol   Drug use: No   Sexual activity: Not Currently    Birth control/protection: Surgical    Comment: hysterectomy  Other Topics Concern   Not on file  Social History Narrative   Not on file   Social Drivers of Health   Financial Resource Strain: Low Risk  (08/16/2024)   Received from [facility]   Overall Financial Resource Strain (CARDIA)    How hard is it for you to pay for the very  basics like food, housing, medical care, and heating?: Not hard at all  Food Insecurity: No Food Insecurity (08/16/2024)   Received from [facility]   Hunger Vital Sign    Within the past 12 months, you worried that your food would run out before you got the money to buy more.: Never true    Within the past 12 months, the food you bought just didn't last and you didn't have money to get more.: Never true  Transportation Needs: No Transportation Needs (08/16/2024)   Received from [facility] - Transportation    Lack of Transportation (Medical): No    Lack of Transportation (Non-Medical): No  Physical Activity: Not on file  Stress: No Stress Concern Present (10/04/2022)   Received from [facility] - Occupational Stress Questionnaire    Feeling of Stress : Not at all  Social Connections: Unknown (02/11/2023)   [facility]   Social Network    Social Network: Not on file    Allergies:  Allergies  Allergen Reactions   Aspirin  Anaphylaxis   Penicillins Anaphylaxis    Metabolic Disorder Labs: Lab Results  Component Value Date   HGBA1C 5.9 (H) 03/26/2024   MPG 123 03/26/2024   No results found for: PROLACTIN Lab Results  Component Value Date   CHOL 237 (H) 03/26/2024   TRIG 129 03/26/2024   HDL 49 (L) 03/26/2024   CHOLHDL 4.8 03/26/2024   LDLCALC 163 (H) 03/26/2024   LDLCALC 153 (H) 05/17/2022   Lab Results  Component Value Date   TSH 1.190 05/17/2022    Therapeutic Level Labs: No results found for: LITHIUM No results found for: VALPROATE No results found for: CBMZ  Current Medications: Current Outpatient Medications  Medication Sig Dispense Refill   DULoxetine  (CYMBALTA ) 20 MG capsule Take 20 mg by mouth daily.     nortriptyline (PAMELOR) 10 MG capsule Take 10 mg by mouth 2 (two) times daily.     QUEtiapine  (SEROQUEL ) 100 MG tablet TAKE 1 TABLET BY MOUTH EVERYDAY AT BEDTIME 90 tablet 0   tiZANidine  (ZANAFLEX ) 2 MG tablet Take 1 tablet (2 mg total) by mouth every 8 (eight) hours as needed for muscle spasms. 90 tablet 1   No current facility-administered medications for this visit.     Musculoskeletal: Strength & Muscle Tone: UTA Gait & Station: Seated Patient leans: N/A  Psychiatric Specialty Exam: Review of Systems  Psychiatric/Behavioral: Negative.      Last menstrual period 12/04/2021.There is no height or weight on file to calculate BMI.  General Appearance: Casual  Eye Contact:  Fair  Speech:  Normal Rate  Volume:  Normal  Mood:  Euthymic  Affect:  Full Range  Thought Process:  Goal Directed and Descriptions of Associations: Intact  Orientation:  Full (Time, Place, and Person)  Thought Content: Logical   Suicidal Thoughts:  No  Homicidal Thoughts:  No  Memory:  Immediate;   Fair Recent;   Fair Remote;   Fair does report short-term memory problems  Judgement:  Fair  Insight:  Fair  Psychomotor Activity:  Normal  Concentration:  Concentration: Fair and Attention  Span: Fair  Recall:  [employer] of Knowledge: Fair  Language: Fair  Akathisia:  No  Handed:  Right  AIMS (if indicated): not done  Assets:  Communication Skills Desire for Improvement Housing Intimacy Social Support Talents/Skills Transportation  ADL's:  Intact  Cognition: WNL  Sleep:  Fair   Screenings: GAD-7    Flowsheet Row Office Visit from 06/28/2024 in [facility] Office Visit from 04/26/2024 in [facility] Office Visit from 03/26/2024 in [facility] Office Visit from 01/16/2023 in [facility] Office Visit from 08/27/2022 in [facility]  Total GAD-7 Score 5 10 12  0 7   Mini-Mental    Flowsheet Row Office Visit from 05/15/2022 in [facility]  Total Score (max 30 points ) 27   PHQ2-9    Flowsheet Row Office Visit from 06/28/2024 in [facility] Office Visit from 04/26/2024 in [facility] Office Visit from 03/26/2024 in [facility] Office Visit from 01/16/2023 in [facility] Office Visit from 08/27/2022 in [facility]  PHQ-2 Total Score 0 2 4 0 0  PHQ-9 Total Score 11 7 17  0 13   Flowsheet Row Video Visit from 10/20/2024 in [facility] Office Visit from 06/28/2024 in [facility] Admission (Discharged) from 07/31/2022 in [facility]  C-SSRS RISK CATEGORY No Risk No Risk No Risk     Assessment and Plan: Elizabeth Peters is a 43 year old American Indian female who presented for a follow-up appointment, discussed assessment and plan as noted below.  1. Bipolar disorder, in full remission, most recent episode depressed Currently denies any manic, hypomanic or  depression symptoms.  Currently compliant with her medications as prescribed and she reports that has helped her to maintain stability. Continue Seroquel  100 mg at bedtime for mood stabilization.  2. Trauma and stressor-related disorder unspecified-improved Currently denies any trauma related symptoms.  Could reevaluate in future sessions. Previously advised to establish care with a therapist to start trauma focused therapy as needed.  3. Anxiety disorder, unspecified type-stable Currently denies any significant anxiety/panic symptoms. Continue Cymbalta  20 mg daily-prescribed by neurology Continue Nortriptyline 10 mg twice a day-prescribed by neurology Currently on multiple psychotropics prescribed by neurology, recommend continue the same. Encouraged to establish care with a therapist as needed.  Patient with continued memory problems, word finding difficulty currently under the care of neurology, advised her to follow-up with neurology for evaluation and management.  Patient was previously advised to get TSH-pending.  She will benefit from labs like TSH, vitamin B12-she agrees to get this completed by primary care provider.  I have also reviewed and discussed hemoglobin A1c-elevated at 5.9, lipid panel-abnormal dated 03/26/2024-patient reports since then she has lost weight and is watching her diet and agrees to follow-up with primary care provider to repeat these labs.  Discussed with patient the importance of repeating these labs since she is also on Seroquel .   Follow-up Follow-up in clinic in 3 to 4 months or sooner if needed.    Collaboration of Care: Collaboration of Care: Primary Care Provider AEB encouraged to follow-up with primary care provider for labs and Other encouraged to follow-up with neurology for memory loss.  Patient/Guardian was advised Release of Information must be obtained prior to any record release in order to collaborate their care with an outside provider.  Patient/Guardian was advised if they have not already done so to contact the registration department to sign all necessary forms in order for us  to release information regarding their care.   Consent: Patient/Guardian gives verbal consent for treatment and  assignment of benefits for services provided during this visit. Patient/Guardian expressed understanding and agreed to proceed.   This note was generated in part or whole with voice recognition software. Voice recognition is usually quite accurate but there are transcription errors that can and very often do occur. I apologize for any typographical errors that were not detected and corrected.    Dalayza Zambrana, MD 10/20/2024, 4:29 PM

## 2024-10-21 ENCOUNTER — Encounter: Payer: Self-pay | Admitting: Nurse Practitioner

## 2024-10-21 NOTE — Telephone Encounter (Signed)
 Requested Prescriptions  Refused Prescriptions Disp Refills   LINZESS  145 MCG CAPS capsule [Pharmacy Med Name: LINZESS  145 MCG CAPSULE] 30 capsule 0    Sig: TAKE 1 CAPSULE BY MOUTH EVERY DAY     Gastroenterology: Irritable Bowel Syndrome Passed - 10/21/2024  3:34 PM      Passed - Valid encounter within last 12 months    Recent Outpatient Visits           4 months ago Overweight (BMI 25.0-29.9)   [facility] Gareth Mliss FALCON, FNP   5 months ago Bipolar 1 disorder [facility])   [facility] Gareth Mliss FALCON, FNP   6 months ago Bipolar 1 disorder [facility])   [facility] Gareth Mliss FALCON, [place]

## 2024-10-22 ENCOUNTER — Other Ambulatory Visit: Payer: Self-pay | Admitting: Nurse Practitioner

## 2024-10-22 DIAGNOSIS — K5904 Chronic idiopathic constipation: Secondary | ICD-10-CM

## 2024-10-22 MED ORDER — LINZESS 145 MCG PO CAPS: 145.0000 ug | ORAL_CAPSULE | Freq: Every day | ORAL | 3 refills | Status: AC

## 2024-11-29 ENCOUNTER — Telehealth: Payer: Self-pay | Admitting: Nurse Practitioner

## 2024-11-29 ENCOUNTER — Ambulatory Visit: Admitting: Psychiatry

## 2024-11-29 NOTE — Telephone Encounter (Unsigned)
 Copied from CRM #[id number]. Topic: General - Other >> Nov 29, 2024 12:02 PM Deaijah H wrote: Reason for CRM: [place] w/ [employer] called in to follow up on forms that were faxed medical opinion form that was faxed 11/20 . Please call back to confirm [phone]

## 2024-11-30 ENCOUNTER — Other Ambulatory Visit: Payer: Self-pay | Admitting: Nurse Practitioner

## 2024-11-30 DIAGNOSIS — M5030 Other cervical disc degeneration, unspecified cervical region: Secondary | ICD-10-CM

## 2024-12-01 NOTE — Telephone Encounter (Signed)
 Left vm w/ patient we have not seen her since July and do not know what these forms are regarding.  Notified if seeing ortho, specialist or PT they will need to fill out.

## 2024-12-02 NOTE — Telephone Encounter (Signed)
 Requested medication (s) are due for refill today: yes  Requested medication (s) are on the active medication list: yes  Last refill:  07/30/24  Future visit scheduled: yes  Notes to clinic:  Unable to refill per protocol, cannot delegate.      Requested Prescriptions  Pending Prescriptions Disp Refills   tiZANidine  (ZANAFLEX ) 2 MG tablet [Pharmacy Med Name: TIZANIDINE  HCL 2 MG TABLET] 270 tablet 1    Sig: TAKE 1 TABLET BY MOUTH EVERY 8 HOURS AS NEEDED FOR MUSCLE SPASMS.     Not Delegated - Cardiovascular:  Alpha-2 Agonists - tizanidine  Failed - 12/02/2024  4:27 PM      Failed - This refill cannot be delegated      Passed - Valid encounter within last 6 months    Recent Outpatient Visits           5 months ago Overweight (BMI 25.0-29.9)   [facility] Gareth Mliss FALCON, FNP   7 months ago Bipolar 1 disorder [facility])   [facility] Gareth Mliss FALCON, FNP   8 months ago Bipolar 1 disorder [facility])   [facility] Gareth Mliss FALCON, [place]

## 2024-12-03 ENCOUNTER — Encounter: Payer: Self-pay | Admitting: Nurse Practitioner

## 2024-12-06 DIAGNOSIS — Z803 Family history of malignant neoplasm of breast: Secondary | ICD-10-CM | POA: Diagnosis not present

## 2024-12-06 DIAGNOSIS — Z981 Arthrodesis status: Secondary | ICD-10-CM | POA: Diagnosis not present

## 2024-12-06 DIAGNOSIS — M5030 Other cervical disc degeneration, unspecified cervical region: Secondary | ICD-10-CM | POA: Diagnosis not present

## 2025-01-14 ENCOUNTER — Other Ambulatory Visit: Payer: Self-pay | Admitting: Psychiatry

## 2025-01-14 ENCOUNTER — Telehealth: Payer: Self-pay | Admitting: Psychiatry

## 2025-01-14 DIAGNOSIS — F419 Anxiety disorder, unspecified: Secondary | ICD-10-CM

## 2025-01-14 NOTE — Telephone Encounter (Signed)
 I did not see an appointment scheduled for this patient.  Will have staff contact patient to schedule.  No future refills without an appointment.  I have sent 30-day supply of Seroquel  for now.

## 2025-03-21 ENCOUNTER — Ambulatory Visit: Admitting: Psychiatry
